# Patient Record
Sex: Male | Born: 1955 | Race: White | Hispanic: No | Marital: Single | Smoking: Never smoker
Health system: Southern US, Community
[De-identification: ages and names within clinical notes are randomized; demographics above are authoritative.]

## PROBLEM LIST (undated history)

## (undated) DIAGNOSIS — R0789 Other chest pain: Secondary | ICD-10-CM

## (undated) DIAGNOSIS — C679 Malignant neoplasm of bladder, unspecified: Secondary | ICD-10-CM

## (undated) DIAGNOSIS — N401 Enlarged prostate with lower urinary tract symptoms: Secondary | ICD-10-CM

## (undated) DIAGNOSIS — D494 Neoplasm of unspecified behavior of bladder: Secondary | ICD-10-CM

## (undated) DIAGNOSIS — K219 Gastro-esophageal reflux disease without esophagitis: Secondary | ICD-10-CM

---
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
## 1981-10-17 HISTORY — PX: WISDOM TOOTH EXTRACTION: SHX21

## 2009-08-07 ENCOUNTER — Encounter: Admission: RE | Admit: 2009-08-07 | Discharge: 2009-08-07 | Payer: Self-pay | Admitting: Family Medicine

## 2011-09-28 ENCOUNTER — Ambulatory Visit (INDEPENDENT_AMBULATORY_CARE_PROVIDER_SITE_OTHER): Payer: PRIVATE HEALTH INSURANCE

## 2011-09-28 DIAGNOSIS — L02619 Cutaneous abscess of unspecified foot: Secondary | ICD-10-CM

## 2011-09-28 DIAGNOSIS — M25579 Pain in unspecified ankle and joints of unspecified foot: Secondary | ICD-10-CM

## 2011-09-28 DIAGNOSIS — L03039 Cellulitis of unspecified toe: Secondary | ICD-10-CM

## 2013-06-21 ENCOUNTER — Ambulatory Visit (INDEPENDENT_AMBULATORY_CARE_PROVIDER_SITE_OTHER): Payer: PRIVATE HEALTH INSURANCE | Admitting: Family Medicine

## 2013-06-21 VITALS — BP 126/70 | HR 80 | Temp 97.5°F | Resp 20 | Ht 72.75 in | Wt 228.4 lb

## 2013-06-21 DIAGNOSIS — K0889 Other specified disorders of teeth and supporting structures: Secondary | ICD-10-CM

## 2013-06-21 DIAGNOSIS — K089 Disorder of teeth and supporting structures, unspecified: Secondary | ICD-10-CM

## 2013-06-21 MED ORDER — HYDROCODONE-ACETAMINOPHEN 5-325 MG PO TABS: 1.0000 | ORAL_TABLET | Freq: Four times a day (QID) | ORAL | Status: DC | PRN

## 2013-06-21 MED ORDER — AMOXICILLIN 500 MG PO CAPS: 500.0000 mg | ORAL_CAPSULE | Freq: Three times a day (TID) | ORAL | Status: DC

## 2013-06-21 NOTE — Patient Instructions (Addendum)
No sign of infection around the tooth at this point, but if any increase in pain, face swelling, fever, or other worsening - return to clinic or go to an emergency room.  Call your dentist to be seen Monday or Tuesday at the latest.   Dental Pain A tooth ache may be caused by cavities (tooth decay). Cavities expose the nerve of the tooth to air and hot or cold temperatures. It may come from an infection or abscess (also called a boil or furuncle) around your tooth. It is also often caused by dental caries (tooth decay). This causes the pain you are having. DIAGNOSIS  Your caregiver can diagnose this problem by exam. TREATMENT   If caused by an infection, it may be treated with medications which kill germs (antibiotics) and pain medications as prescribed by your caregiver. Take medications as directed.  Only take over-the-counter or prescription medicines for pain, discomfort, or fever as directed by your caregiver.  Whether the tooth ache today is caused by infection or dental disease, you should see your dentist as soon as possible for further care. SEEK MEDICAL CARE IF: The exam and treatment you received today has been provided on an emergency basis only. This is not a substitute for complete medical or dental care. If your problem worsens or new problems (symptoms) appear, and you are unable to meet with your dentist, call or return to this location. SEEK IMMEDIATE MEDICAL CARE IF:   You have a fever.  You develop redness and swelling of your face, jaw, or neck.  You are unable to open your mouth.  You have severe pain uncontrolled by pain medicine. MAKE SURE YOU:   Understand these instructions.  Will watch your condition.  Will get help right away if you are not doing well or get worse. Document Released: 10/03/2005 Document Revised: 12/26/2011 Document Reviewed: 05/21/2008 [facility] Patient Information 2014 [place], [place].

## 2013-06-21 NOTE — Progress Notes (Signed)
Subjective:    Patient ID: Kevin Buck, male    DOB: 04-Nov-1955, 57 y.o.   MRN: [id number]  HPI [name] is a 57 y.o. male  Severe toothache - R upper mouth - starting yesterday around 3:30pm.  Dentist: Dr. Azucena Kuba in [facility] - tried calling last night and this am - unable to reach them. No fever.  Able to eat/drink.  Chewing on other side.  Has had mutiple fillings. No soreness prior to yesterday.   No recent Rx pain medicines.  2 advil, 2 tylenol yesterday. 4 ibuprofen today. Not getting relief.    Review of Systems  Constitutional: Negative for fever and chills.  HENT: Positive for dental problem. Negative for congestion and sinus pressure.   Skin: Negative for rash.       Objective:   Physical Exam  Vitals reviewed. Constitutional: He is oriented to person, place, and time. He appears well-developed and well-nourished.  HENT:  Head: Normocephalic and atraumatic. Head is without right periorbital erythema and without left periorbital erythema.  Right Ear: Tympanic membrane, external ear and ear canal normal.  Left Ear: Tympanic membrane, external ear and ear canal normal.  Nose: No rhinorrhea. Right sinus exhibits no maxillary sinus tenderness and no frontal sinus tenderness. Left sinus exhibits no maxillary sinus tenderness and no frontal sinus tenderness.  Mouth/Throat: Uvula is midline, oropharynx is clear and moist and mucous membranes are normal. No oral lesions. No dental abscesses, edematous or lacerations. No oropharyngeal exudate or posterior oropharyngeal erythema.    Eyes: Conjunctivae are normal. Pupils are equal, round, and reactive to light.  Neck: Neck supple.  Cardiovascular: Normal rate, regular rhythm, normal heart sounds and intact distal pulses.   No murmur heard. Pulmonary/Chest: Effort normal. He has no rhonchi.  Abdominal: Soft.  Lymphadenopathy:    He has no cervical adenopathy.  Neurological: He is alert and oriented to person,  place, and time.  Skin: Skin is warm and dry. No rash noted.  Psychiatric: He has a normal mood and affect. His behavior is normal.          Assessment & Plan:  [name] is a 57 y.o. male Pain, dental - Plan: HYDROcodone-acetaminophen (NORCO/VICODIN) 5-325 MG per tablet, amoxicillin (AMOXIL) 500 MG capsule no sign of abscess at present, RTC/ER precautions discussed. Start amoxicillin, lortab 1-2 Q6hprn -SED, and to follow up with dentist first part of next week.   Meds ordered this encounter  Medications  . HYDROcodone-acetaminophen (NORCO/VICODIN) 5-325 MG per tablet    Sig: Take 1-2 tablets by mouth every 6 (six) hours as needed for pain.    Dispense:  30 tablet    Refill:  0  . amoxicillin (AMOXIL) 500 MG capsule    Sig: Take 1 capsule (500 mg total) by mouth 3 (three) times daily.    Dispense:  30 capsule    Refill:  0   Patient Instructions  No sign of infection around the tooth at this point, but if any increase in pain, face swelling, fever, or other worsening - return to clinic or go to an emergency room.  Call your dentist to be seen Monday or Tuesday at the latest.   Dental Pain A tooth ache may be caused by cavities (tooth decay). Cavities expose the nerve of the tooth to air and hot or cold temperatures. It may come from an infection or abscess (also called a boil or furuncle) around your tooth. It is also often caused by dental caries (  tooth decay). This causes the pain you are having. DIAGNOSIS  Your caregiver can diagnose this problem by exam. TREATMENT   If caused by an infection, it may be treated with medications which kill germs (antibiotics) and pain medications as prescribed by your caregiver. Take medications as directed.  Only take over-the-counter or prescription medicines for pain, discomfort, or fever as directed by your caregiver.  Whether the tooth ache today is caused by infection or dental disease, you should see your dentist as soon as  possible for further care. SEEK MEDICAL CARE IF: The exam and treatment you received today has been provided on an emergency basis only. This is not a substitute for complete medical or dental care. If your problem worsens or new problems (symptoms) appear, and you are unable to meet with your dentist, call or return to this location. SEEK IMMEDIATE MEDICAL CARE IF:   You have a fever.  You develop redness and swelling of your face, jaw, or neck.  You are unable to open your mouth.  You have severe pain uncontrolled by pain medicine. MAKE SURE YOU:   Understand these instructions.  Will watch your condition.  Will get help right away if you are not doing well or get worse. Document Released: 10/03/2005 Document Revised: 12/26/2011 Document Reviewed: 05/21/2008 [facility] Patient Information 2014 [place], [place].

## 2015-10-25 ENCOUNTER — Encounter (HOSPITAL_COMMUNITY): Payer: Self-pay | Admitting: Nurse Practitioner

## 2015-10-25 ENCOUNTER — Emergency Department (HOSPITAL_COMMUNITY): Payer: Worker's Compensation

## 2015-10-25 ENCOUNTER — Emergency Department (HOSPITAL_COMMUNITY)
Admission: EM | Admit: 2015-10-25 | Discharge: 2015-10-25 | Disposition: A | Discharge status: Home / Self Care | Payer: Worker's Compensation | Attending: Emergency Medicine | Admitting: Emergency Medicine

## 2015-10-25 DIAGNOSIS — S7012XA Contusion of left thigh, initial encounter: Secondary | ICD-10-CM | POA: Diagnosis not present

## 2015-10-25 DIAGNOSIS — S199XXA Unspecified injury of neck, initial encounter: Secondary | ICD-10-CM | POA: Diagnosis not present

## 2015-10-25 DIAGNOSIS — M4802 Spinal stenosis, cervical region: Secondary | ICD-10-CM | POA: Insufficient documentation

## 2015-10-25 DIAGNOSIS — W000XXA Fall on same level due to ice and snow, initial encounter: Secondary | ICD-10-CM | POA: Insufficient documentation

## 2015-10-25 DIAGNOSIS — Y9389 Activity, other specified: Secondary | ICD-10-CM | POA: Insufficient documentation

## 2015-10-25 DIAGNOSIS — S0081XA Abrasion of other part of head, initial encounter: Secondary | ICD-10-CM | POA: Insufficient documentation

## 2015-10-25 DIAGNOSIS — M9981 Other biomechanical lesions of cervical region: Secondary | ICD-10-CM

## 2015-10-25 DIAGNOSIS — Y990 Civilian activity done for income or pay: Secondary | ICD-10-CM | POA: Diagnosis not present

## 2015-10-25 DIAGNOSIS — M542 Cervicalgia: Secondary | ICD-10-CM

## 2015-10-25 DIAGNOSIS — Z792 Long term (current) use of antibiotics: Secondary | ICD-10-CM | POA: Insufficient documentation

## 2015-10-25 DIAGNOSIS — S0990XA Unspecified injury of head, initial encounter: Secondary | ICD-10-CM

## 2015-10-25 DIAGNOSIS — Y9289 Other specified places as the place of occurrence of the external cause: Secondary | ICD-10-CM | POA: Insufficient documentation

## 2015-10-25 NOTE — ED Notes (Signed)
See PA assessment 

## 2015-10-25 NOTE — ED Notes (Signed)
Declined W/C at D/C and was escorted to lobby by RN. 

## 2015-10-25 NOTE — ED Notes (Addendum)
He slipped on snow yesterday and fell, hitting his head on a steel plate. He reports possible brief LOC. He has abrasions to L forehead, L cheek, L upper leg. He c/o L upper leg and forehead pain. He does not take blood thinners. He is A&Ox4, breathing easily.

## 2015-10-25 NOTE — ED Provider Notes (Signed)
CSN: [id number]     Arrival date & time 10/25/15  1225 History  By signing my name below, I, Starleen Arms, attest that this documentation has been prepared under the direction and in the presence of Shary Decamp, PA-C. Electronically Signed: Starleen Arms ED Scribe. 10/25/2015. 12:52 PM.    Chief Complaint  Patient presents with  . Fall   The history is provided by the patient. No language interpreter was used.   HPI Comments: Kevin Buck is a 60 y.o. male with no chronic conditions who presents to the Emergency Department complaining of an unwitnessed, mechanical fall fall that occurred yesterday at work.  The patient reports he slipped sideways on ice and fell onto his left thigh and hit his left forehead on a steel plate.  He complains currently of possible 1-2 second LOC, abrasions on the left forehead, mild headache rated 1/10, improved neck stiffness, and bruising on the left thigh.  No treatments tried.  He does not use anti-coagulants.  He denies vision changes, extremity numbness/tingling, diarrhea, n/v, CP, SOB other complaints.   History reviewed. No pertinent past medical history. History reviewed. No pertinent past surgical history. History reviewed. No pertinent family history. Social History  Substance Use Topics  . Smoking status: Never Smoker   . Smokeless tobacco: None  . Alcohol Use: Yes     Comment: rare    Review of Systems  Constitutional: Negative for fever, chills, diaphoresis and fatigue.  HENT: Negative for congestion, sinus pressure, sore throat and tinnitus.   Eyes: Negative for visual disturbance.  Respiratory: Negative for cough and shortness of breath.   Cardiovascular: Negative for chest pain.  Gastrointestinal: Negative for nausea, vomiting, abdominal pain, diarrhea and constipation.  Endocrine: Negative for cold intolerance and heat intolerance.  Genitourinary: Negative for dysuria and hematuria.  Musculoskeletal: Positive for neck pain. Negative  for back pain.  Skin: Positive for wound. Negative for color change.  Neurological: Positive for headaches. Negative for dizziness, weakness and numbness.   10 Systems reviewed and all are negative for acute change except as noted in the HPI.  Allergies  Review of patient's allergies indicates no known allergies.  Home Medications   Prior to Admission medications   Medication Sig Start Date End Date Taking? Authorizing Provider  amoxicillin (AMOXIL) 500 MG capsule Take 1 capsule (500 mg total) by mouth 3 (three) times daily. 06/21/13   Wendie Agreste, MD  HYDROcodone-acetaminophen (NORCO/VICODIN) 5-325 MG per tablet Take 1-2 tablets by mouth every 6 (six) hours as needed for pain. 06/21/13   Wendie Agreste, MD   BP 121/84 mmHg  Pulse 103  Temp(Src) 98 F (36.7 C) (Oral)  Resp 18  SpO2 97%   Physical Exam  Constitutional: He is oriented to person, place, and time. He appears well-developed and well-nourished. No distress.  HENT:  Head: Normocephalic and atraumatic. Head is without raccoon's eyes, without Battle's sign, without contusion, without laceration, without right periorbital erythema and without left periorbital erythema.  TTP over c-7.    Eyes: Conjunctivae and EOM are normal. Pupils are equal, round, and reactive to light.  Neck: Normal range of motion. Neck supple. Spinous process tenderness present. No tracheal deviation present.  TTP over c-7.    Cardiovascular: Normal rate.   Pulmonary/Chest: Effort normal. No respiratory distress.  Musculoskeletal: Normal range of motion.  Neurological: He is alert and oriented to person, place, and time. He has normal strength. No cranial nerve deficit or sensory deficit. He displays a  negative Romberg sign.  Skin: Skin is warm and dry. Abrasion noted.  Abrasion noted on L forehead   Psychiatric: He has a normal mood and affect. His behavior is normal.  Nursing note and vitals reviewed.  ED Course  Procedures (including  critical care time)  DIAGNOSTIC STUDIES: Oxygen Saturation is 97% on RA, normal by my interpretation.    COORDINATION OF CARE:  12:52 PM Discussed treatment plan with patient at bedside which includes CT head and neck.  Patient acknowledges and agrees with plan.    Labs Review Labs Reviewed - No data to display  Imaging Review Ct Head Wo Contrast  10/25/2015  CLINICAL DATA:  Fall yesterday, injury to left frontal side of head, lost consciousness for about 5 seconds. Pain and stiffness on left side of neck. EXAM: CT HEAD WITHOUT CONTRAST CT CERVICAL SPINE WITHOUT CONTRAST TECHNIQUE: Multidetector CT imaging of the head and cervical spine was performed following the standard protocol without intravenous contrast. Multiplanar CT image reconstructions of the cervical spine were also generated. COMPARISON:  None. FINDINGS: CT HEAD FINDINGS There is mild generalized brain atrophy with commensurate dilatation of the ventricles and sulci. All areas of the brain demonstrate normal gray-white matter attenuation. There is no mass, hemorrhage, edema or other evidence of acute parenchymal abnormality. No extra-axial hemorrhage. Superficial soft tissues are unremarkable. No osseous fracture or dislocation seen. Minimal mucosal thickening noted within the paranasal sinuses, of uncertain age but most likely chronic. CT CERVICAL SPINE FINDINGS Degenerative changes are seen throughout the cervical spine, mild to moderate in degree, with associated disc space narrowings and osseous spurring. Associated disc-osteophytic bulges at the C3-4 and C6-7 levels causing mild to moderate central canal stenoses. Additional degenerative hypertrophy of the uncovertebral joints at the C6-7 level is causing moderate to severe bilateral neural foramen stenoses with possible associated nerve root impingement. Similar, but slightly less prominent, uncovertebral joint hypertrophy at C3-4 is causing moderate bilateral neural foramen  stenoses. There is no fracture line or displaced fracture fragment. Facet joints are well aligned throughout. Paravertebral soft tissues are unremarkable. IMPRESSION: 1. No evidence of acute intracranial abnormality. No intracranial hemorrhage or edema. No skull fracture. 2. No fracture or acute subluxation within the cervical spine. 3. Degenerative changes of the cervical spine, as detailed above. Combination of disc-osteophytic bulge and degenerative uncovertebral joint hypertrophy at C6-7 is causing moderate to severe neural foramen stenoses bilaterally with possible associated nerve root impingements. This is a possible source for patient's left neck symptoms. There are additional neural foramen stenoses at C3-4, moderate in degree, with additional possible nerve root impingement. Electronically Signed   By: Franki Cabot M.D.   On: 10/25/2015 14:04   Ct Cervical Spine Wo Contrast  10/25/2015  CLINICAL DATA:  Fall yesterday, injury to left frontal side of head, lost consciousness for about 5 seconds. Pain and stiffness on left side of neck. EXAM: CT HEAD WITHOUT CONTRAST CT CERVICAL SPINE WITHOUT CONTRAST TECHNIQUE: Multidetector CT imaging of the head and cervical spine was performed following the standard protocol without intravenous contrast. Multiplanar CT image reconstructions of the cervical spine were also generated. COMPARISON:  None. FINDINGS: CT HEAD FINDINGS There is mild generalized brain atrophy with commensurate dilatation of the ventricles and sulci. All areas of the brain demonstrate normal gray-white matter attenuation. There is no mass, hemorrhage, edema or other evidence of acute parenchymal abnormality. No extra-axial hemorrhage. Superficial soft tissues are unremarkable. No osseous fracture or dislocation seen. Minimal mucosal thickening noted within the paranasal  sinuses, of uncertain age but most likely chronic. CT CERVICAL SPINE FINDINGS Degenerative changes are seen throughout the  cervical spine, mild to moderate in degree, with associated disc space narrowings and osseous spurring. Associated disc-osteophytic bulges at the C3-4 and C6-7 levels causing mild to moderate central canal stenoses. Additional degenerative hypertrophy of the uncovertebral joints at the C6-7 level is causing moderate to severe bilateral neural foramen stenoses with possible associated nerve root impingement. Similar, but slightly less prominent, uncovertebral joint hypertrophy at C3-4 is causing moderate bilateral neural foramen stenoses. There is no fracture line or displaced fracture fragment. Facet joints are well aligned throughout. Paravertebral soft tissues are unremarkable. IMPRESSION: 1. No evidence of acute intracranial abnormality. No intracranial hemorrhage or edema. No skull fracture. 2. No fracture or acute subluxation within the cervical spine. 3. Degenerative changes of the cervical spine, as detailed above. Combination of disc-osteophytic bulge and degenerative uncovertebral joint hypertrophy at C6-7 is causing moderate to severe neural foramen stenoses bilaterally with possible associated nerve root impingements. This is a possible source for patient's left neck symptoms. There are additional neural foramen stenoses at C3-4, moderate in degree, with additional possible nerve root impingement. Electronically Signed   By: Franki Cabot M.D.   On: 10/25/2015 14:04   I have personally reviewed and evaluated these images and lab results as part of my medical decision-making.   EKG Interpretation None      MDM  I have reviewed relevant imaging studies. I have reviewed the relevant previous healthcare records.I obtained HPI from historian. Patient discussed with supervising physician  ED Course: CT Head W/O Contrast- Negative Neck W/O Contrast See above  Assessment: 7y M with no sig pmh presents with an unwitnessed fall with poss LOC while at work. CN intact. Neg Romberg. No sensory/motor  deficits. AxOx4. Pt in NAD. VS Stable. Pt able to ambulate. CT scan revealed some mod-severe neuro foraminal stenosis. Pt exhibiting no numbness/tingling or decrease grip strength. Given strict return precautions as well as information for follow up to Neurosurgery within the next 48 hours.      Disposition/Plan:  DC Home w/ follow up to Neurosurgery Outpatient  Additional Verbal discharge instructions given and discussed with patient.  Strict return precautions given Pt acknowledges and agrees with plan    Supervising Physician Charlesetta Shanks, MD   Final diagnoses:  Neck pain  Head injury, initial encounter  Neural foraminal stenosis of cervical spine    I personally performed the services described in this documentation, which was scribed in my presence. The recorded information has been reviewed and is accurate.   Shary Decamp, PA-C 10/25/15 1845  Charlesetta Shanks, MD 10/27/15 [phone]

## 2015-10-25 NOTE — Discharge Instructions (Signed)
Please read and follow all provided instructions.  Your diagnoses today include:  1. Neural foraminal stenosis of cervical spine   2. Neck pain   3. Head injury, initial encounter    Tests performed today include:  Vital signs. See below for your results today.   Medications prescribed:   None   Home care instructions:  Take Ibuprofen as needed for pain. Follow instructions on over the counter bottle.   Follow-up instructions: Please follow-up with Neurosurgeon (Dr. Cyndy Freeze) for evaluation of the narrowing neuro foramen in your neck within the next 48 hours.   Return instructions:   Please return to the Emergency Department if you do not get better, if you get worse, or new symptoms OR  - Fever (temperature greater than 101.44F)  - Bleeding that does not stop with holding pressure to the area    -Severe pain (please note that you may be more sore the day after your accident)  - Chest Pain  - Difficulty breathing  - Severe nausea or vomiting  - Inability to tolerate food and liquids  - Passing out  - Skin becoming red around your wounds  - Change in mental status (confusion or lethargy)  - New numbness or weakness, Difficulty holding objects  Please return if you have any other emergent concerns.  Additional Information:  Your vital signs today were: BP 121/84 mmHg   Pulse 103   Temp(Src) 98 F (36.7 C) (Oral)   Resp 18   SpO2 97% If your blood pressure (BP) was elevated above 135/85 this visit, please have this repeated by your doctor within one month. ---------------

## 2015-12-29 ENCOUNTER — Ambulatory Visit (INDEPENDENT_AMBULATORY_CARE_PROVIDER_SITE_OTHER): Payer: BLUE CROSS/BLUE SHIELD | Admitting: Family Medicine

## 2015-12-29 ENCOUNTER — Ambulatory Visit (INDEPENDENT_AMBULATORY_CARE_PROVIDER_SITE_OTHER): Payer: BLUE CROSS/BLUE SHIELD

## 2015-12-29 VITALS — BP 126/78 | HR 99 | Temp 98.7°F | Resp 18 | Ht 73.0 in | Wt 240.2 lb

## 2015-12-29 DIAGNOSIS — R6883 Chills (without fever): Secondary | ICD-10-CM

## 2015-12-29 DIAGNOSIS — R05 Cough: Secondary | ICD-10-CM | POA: Diagnosis not present

## 2015-12-29 DIAGNOSIS — R059 Cough, unspecified: Secondary | ICD-10-CM

## 2015-12-29 DIAGNOSIS — J209 Acute bronchitis, unspecified: Secondary | ICD-10-CM

## 2015-12-29 MED ORDER — HYDROCODONE-HOMATROPINE 5-1.5 MG/5ML PO SYRP: 5.0000 mL | ORAL_SOLUTION | Freq: Three times a day (TID) | ORAL | Status: DC | PRN

## 2015-12-29 MED ORDER — AZITHROMYCIN 250 MG PO TABS: ORAL_TABLET | ORAL | Status: DC

## 2015-12-29 MED ORDER — PREDNISONE 20 MG PO TABS: ORAL_TABLET | ORAL | Status: DC

## 2015-12-29 NOTE — Patient Instructions (Addendum)
   IF you received an x-ray today, you will receive an invoice from [facility]. Please contact [facility] at [phone] with questions or concerns regarding your invoice.   IF you received labwork today, you will receive an invoice from [facility]. Please contact [facility] at [phone] with questions or concerns regarding your invoice.   Our billing staff will not be able to assist you with questions regarding bills from these companies.  You will be contacted with the lab results as soon as they are available. The fastest way to get your results is to activate your My Chart account. Instructions are located on the last page of this paperwork. If you have not heard from us regarding the results in 2 weeks, please contact this office.    Acute Bronchitis Bronchitis is inflammation of the airways that extend from the windpipe into the lungs (bronchi). The inflammation often causes mucus to develop. This leads to a cough, which is the most common symptom of bronchitis.  In acute bronchitis, the condition usually develops suddenly and goes away over time, usually in a couple weeks. Smoking, allergies, and asthma can make bronchitis worse. Repeated episodes of bronchitis may cause further lung problems.  CAUSES Acute bronchitis is most often caused by the same virus that causes a cold. The virus can spread from person to person (contagious) through coughing, sneezing, and touching contaminated objects. SIGNS AND SYMPTOMS   Cough.   Fever.   Coughing up mucus.   Body aches.   Chest congestion.   Chills.   Shortness of breath.   Sore throat.  DIAGNOSIS  Acute bronchitis is usually diagnosed through a physical exam. Your health care provider will also ask you questions about your medical history. Tests, such as chest X-rays, are sometimes done to rule out other conditions.  TREATMENT  Acute bronchitis usually goes away in a  couple weeks. Oftentimes, no medical treatment is necessary. Medicines are sometimes given for relief of fever or cough. Antibiotic medicines are usually not needed but may be prescribed in certain situations. In some cases, an inhaler may be recommended to help reduce shortness of breath and control the cough. A cool mist vaporizer may also be used to help thin bronchial secretions and make it easier to clear the chest.  HOME CARE INSTRUCTIONS  Get plenty of rest.   Drink enough fluids to keep your urine clear or pale yellow (unless you have a medical condition that requires fluid restriction). Increasing fluids may help thin your respiratory secretions (sputum) and reduce chest congestion, and it will prevent dehydration.   Take medicines only as directed by your health care provider.  If you were prescribed an antibiotic medicine, finish it all even if you start to feel better.  Avoid smoking and secondhand smoke. Exposure to cigarette smoke or irritating chemicals will make bronchitis worse. If you are a smoker, consider using nicotine gum or skin patches to help control withdrawal symptoms. Quitting smoking will help your lungs heal faster.   Reduce the chances of another bout of acute bronchitis by washing your hands frequently, avoiding people with cold symptoms, and trying not to touch your hands to your mouth, nose, or eyes.   Keep all follow-up visits as directed by your health care provider.  SEEK MEDICAL CARE IF: Your symptoms do not improve after 1 week of treatment.  SEEK IMMEDIATE MEDICAL CARE IF:  You develop an increased fever or chills.   You have chest pain.     You have severe shortness of breath.  You have bloody sputum.   You develop dehydration.  You faint or repeatedly feel like you are going to pass out.  You develop repeated vomiting.  You develop a severe headache. MAKE SURE YOU:   Understand these instructions.  Will watch your  condition.  Will get help right away if you are not doing well or get worse.   This information is not intended to replace advice given to you by your health care provider. Make sure you discuss any questions you have with your health care provider.   Document Released: 11/10/2004 Document Revised: 10/24/2014 Document Reviewed: 03/26/2013 [employer] Interactive Patient Education 2016 [employer]  

## 2015-12-29 NOTE — Progress Notes (Signed)
Patient ID: Kevin Buck MRN: [id number], DOB: [birth year], 60 y.o. Date of Encounter: 12/29/2015, 1:20 PM  Primary Physician: No primary care provider on file.  Chief Complaint:  Chief Complaint  Patient presents with  . Cough    3 weeks  . bodyaches  . Chills  . little diarrhea    HPI: 60 y.o. year old male presents with a 21 day history of nasal congestion, post nasal drip, sore throat, and cough. Mild sinus pressure. Afebrile. No chills.  Cough is nonproductive of sputum and not associated with time of day. Ears feel full, leading to sensation of muffled hearing. Has tried OTC cold preps including echinacea without success. No GI complaints.    No sick contacts, recent antibiotics, or recent travels.   No leg trauma, sedentary periods, h/o cancer, or tobacco use.  Patient works as a Barista  No past medical history on file.   Home Meds: Prior to Admission medications   Not on File    Allergies: No Known Allergies  Social History   Social History  . Marital Status: Single    Spouse Name: N/A  . Number of Children: N/A  . Years of Education: N/A   Occupational History  . Not on file.   Social History Main Topics  . Smoking status: Never Smoker   . Smokeless tobacco: Not on file  . Alcohol Use: Yes     Comment: rare  . Drug Use: No  . Sexual Activity: Not on file   Other Topics Concern  . Not on file   Social History Narrative     Review of Systems: Constitutional: negative for chills, fever, night sweats or weight changes Cardiovascular: negative for chest pain or palpitations Respiratory: negative for hemoptysis, wheezing, or shortness of breath Abdominal: negative for abdominal pain, nausea, vomiting or diarrhea Dermatological: negative for rash Neurologic: negative for headache   Physical Exam: Blood pressure 126/78, pulse 99, temperature 98.7 F (37.1 C), temperature source Oral, resp. rate 18, height 6\' 1"  (1.854  m), weight 240 lb 3.2 oz (108.954 kg), SpO2 98 %., Body mass index is 31.7 kg/(m^2). General: Well developed, well nourished, in no acute distress. Head: Normocephalic, atraumatic, eyes without discharge, sclera non-icteric, nares are congested. Bilateral auditory canals clear, TM's are without perforation, pearly grey with reflective cone of light bilaterally. No sinus TTP. Oral cavity moist, dentition normal. Posterior pharynx with post nasal drip and mild erythema. No peritonsillar abscess or tonsillar exudate. Neck: Supple. No thyromegaly. Full ROM. No lymphadenopathy. Lungs: Coarse breath sounds bilaterally without wheezes, rales, or rhonchi. Breathing is unlabored.  Heart: RRR with S1 S2. No murmurs, rubs, or gallops appreciated. Msk:  Strength and tone normal for age. Extremities: No clubbing or cyanosis. No edema. Neuro: Alert and oriented X 3. Moves all extremities spontaneously. CNII-XII grossly in tact. Psych:  Responds to questions appropriately with a normal affect.  Chest x-ray shows peribronchial cuffing and moderate bronchial thickening   ASSESSMENT AND PLAN:  60 y.o. year old male with bronchitis. -   ICD-9-CM ICD-10-CM   1. Acute bronchitis, unspecified organism 466.0 J20.9 azithromycin (ZITHROMAX) 250 MG tablet     HYDROcodone-homatropine (HYCODAN) 5-1.5 MG/5ML syrup     predniSONE (DELTASONE) 20 MG tablet  2. Cough 786.2 R05 DG Chest 2 View  3. Chills 780.64 R68.83 DG Chest 2 View   -Tylenol/Motrin prn -Rest/fluids -RTC precautions -RTC 3-5 days if no improvement  Signed, Robyn Haber, MD 12/29/2015 1:20 PM  This chart was scribed in my presence and reviewed by me personally.    ICD-9-CM ICD-10-CM   1. Acute bronchitis, unspecified organism 466.0 J20.9 azithromycin (ZITHROMAX) 250 MG tablet     HYDROcodone-homatropine (HYCODAN) 5-1.5 MG/5ML syrup     predniSONE (DELTASONE) 20 MG tablet  2. Cough 786.2 R05 DG Chest 2 View  3. Chills 780.64 R68.83  DG Chest 2 View     Signed, Robyn Haber, MD

## 2017-02-08 IMAGING — CT CT CERVICAL SPINE W/O CM
3 of 6 series · 10 of 33 positions shown, 11 images · non-contrast
Comparison: None.

CLINICAL DATA: Fall yesterday, injury to left frontal side of head,
lost consciousness for about 5 seconds. Pain and stiffness on left
side of neck.

EXAM:
CT HEAD WITHOUT CONTRAST
CT CERVICAL SPINE WITHOUT CONTRAST
TECHNIQUE: Multidetector CT imaging of the head and cervical spine was
performed following the standard protocol without intravenous
contrast. Multiplanar CT image reconstructions of the cervical spine
were also generated.

[Series 5: c_spine 2.0 st · axial · 0.35mm/px · z∈[-252,-142]mm · 2 of 111 slices shown, 3 images]
[im 28/111  soft-tissue]
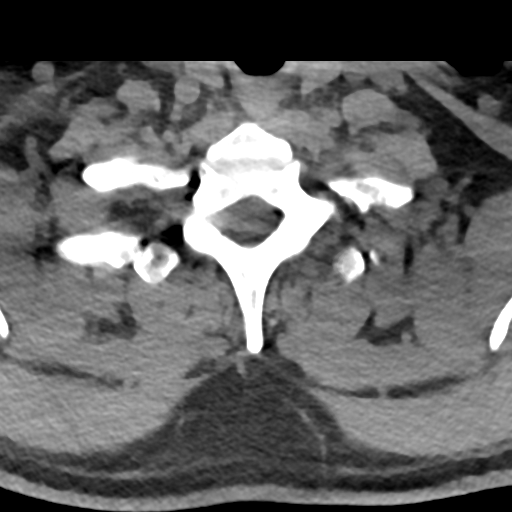
[im 28/111  bone]
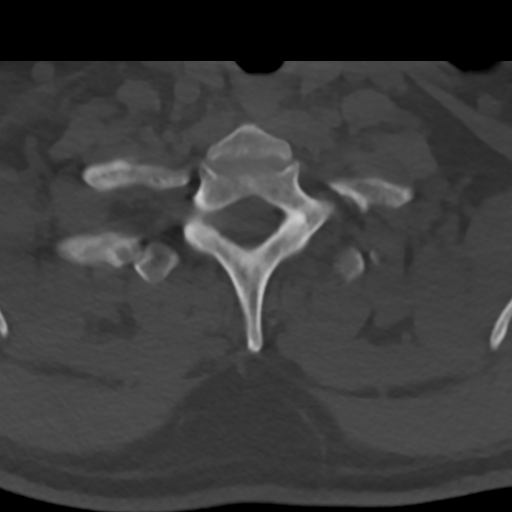
[im 83/111  bone]
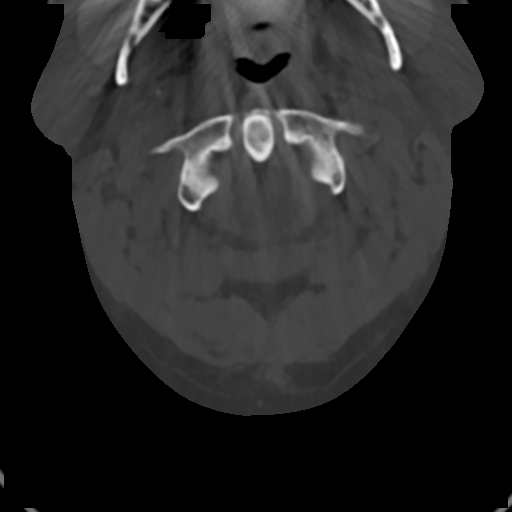

[Series 7: c_spine 2.0 sag bone · sagittal · 0.24mm/px · 5 of 61 slices shown]
[im 21/61  bone]
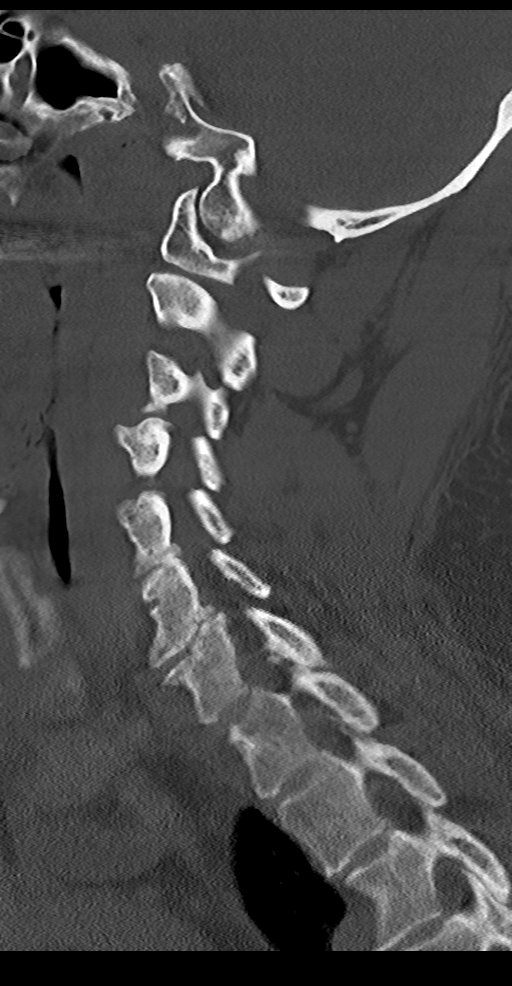
[im 26/61  bone]
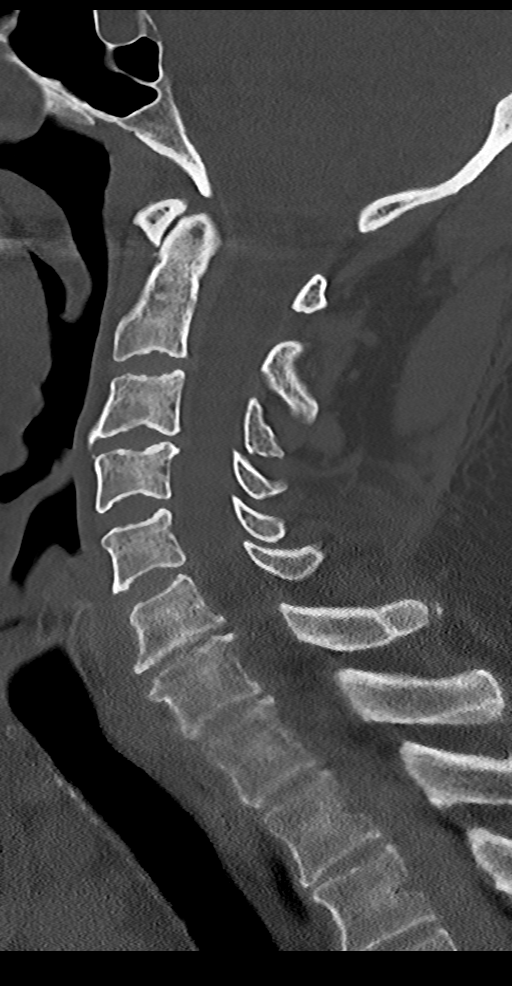
[im 31/61  bone]
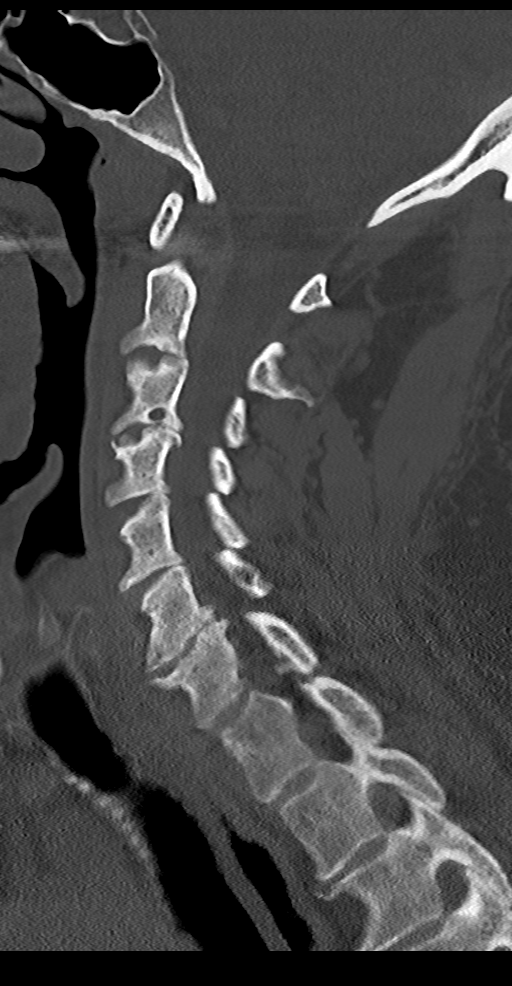
[im 36/61  bone]
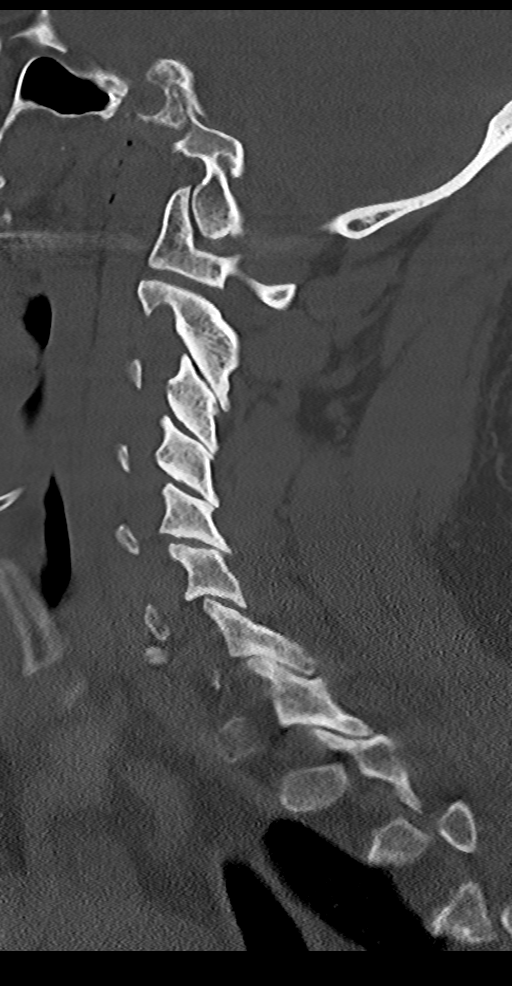
[im 41/61  bone]
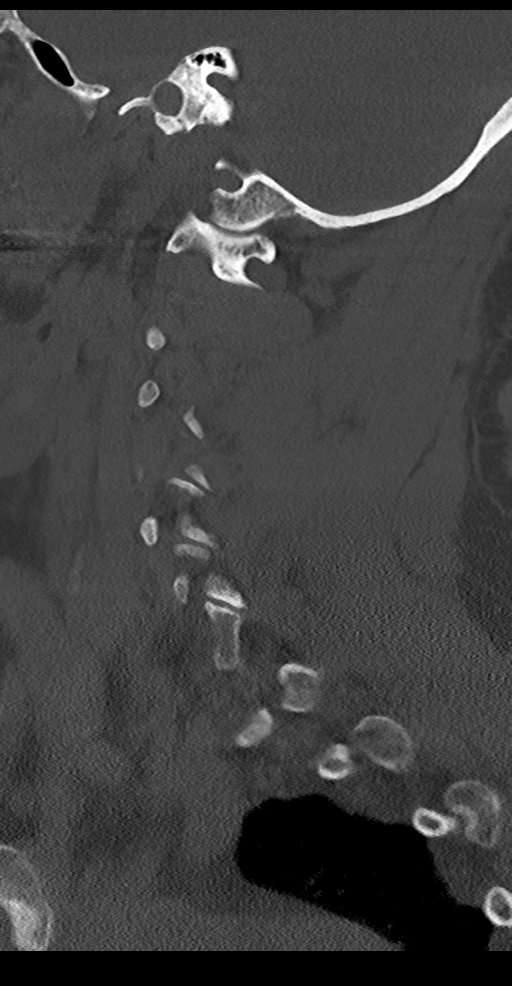

[Series 8: c_spine 2.0 cor bone · coronal · 0.21mm/px · 3 of 61 slices shown]
[im 13/61  bone]
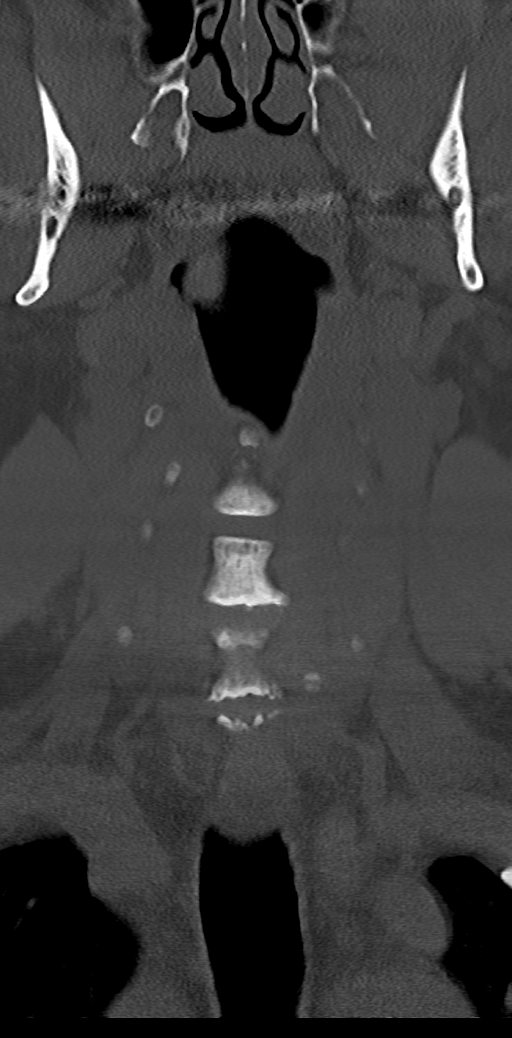
[im 25/61  bone]
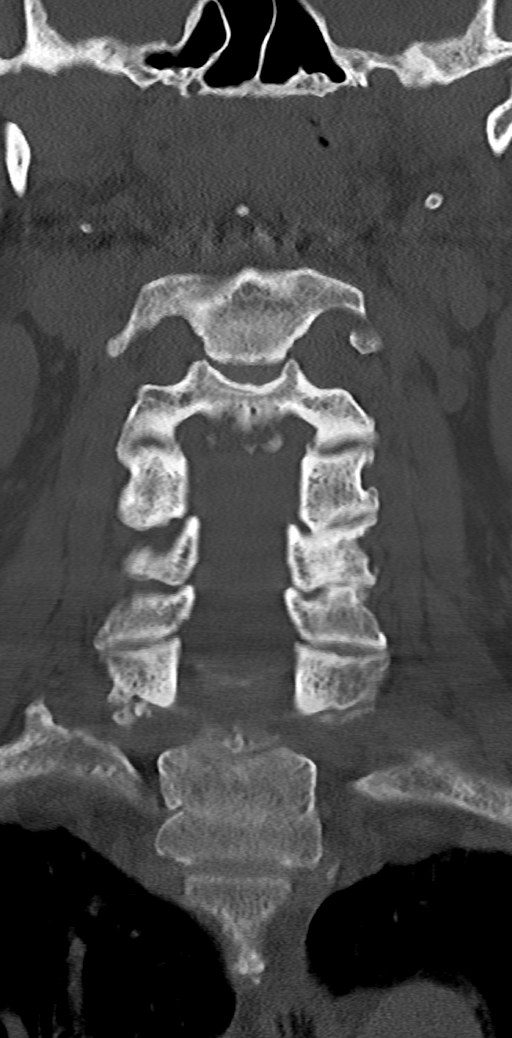
[im 37/61  bone]
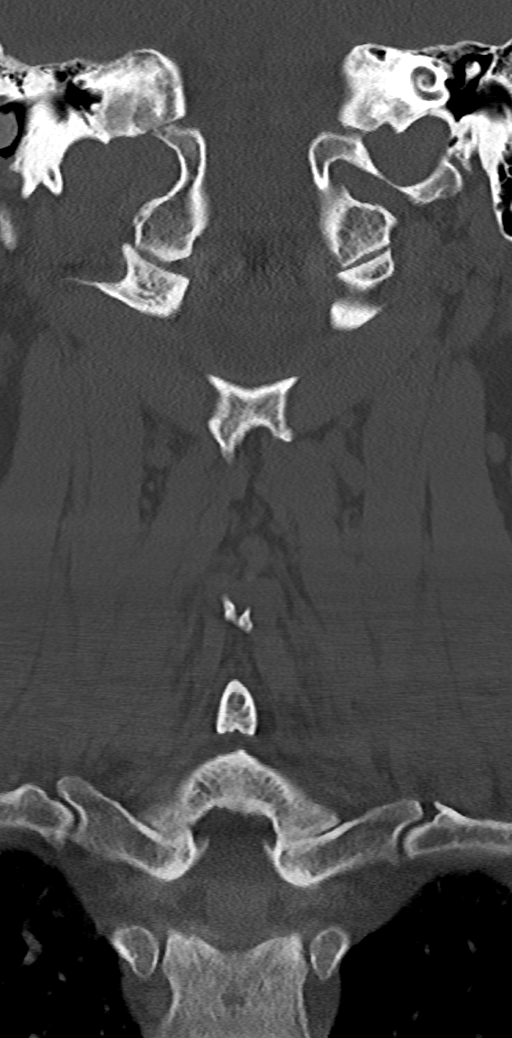

[10 of 33 positions shown; findings below may reference images not displayed]

FINDINGS: CT HEAD FINDINGS

There is mild generalized brain atrophy with commensurate dilatation
of the ventricles and sulci. All areas of the brain demonstrate
normal gray-white matter attenuation. There is no mass, hemorrhage,
edema or other evidence of acute parenchymal abnormality. No
extra-axial hemorrhage.

Superficial soft tissues are unremarkable. No osseous fracture or
dislocation seen. Minimal mucosal thickening noted within the
paranasal sinuses, of uncertain age but most likely chronic.

CT CERVICAL SPINE FINDINGS

Degenerative changes are seen throughout the cervical spine, mild to
moderate in degree, with associated disc space narrowings and
osseous spurring. Associated disc-osteophytic bulges at the C3-4 and
C6-7 levels causing mild to moderate central canal stenoses.
Additional degenerative hypertrophy of the uncovertebral joints at
the C6-7 level is causing moderate to severe bilateral neural
foramen stenoses with possible associated nerve root impingement.
Similar, but slightly less prominent, uncovertebral joint
hypertrophy at C3-4 is causing moderate bilateral neural foramen
stenoses.

There is no fracture line or displaced fracture fragment. Facet
joints are well aligned throughout. Paravertebral soft tissues are
unremarkable.
IMPRESSION: 1. No evidence of acute intracranial abnormality. No intracranial
hemorrhage or edema. No skull fracture.
2. No fracture or acute subluxation within the cervical spine.
3. Degenerative changes of the cervical spine, as detailed above.
Combination of disc-osteophytic bulge and degenerative uncovertebral
joint hypertrophy at C6-7 is causing moderate to severe neural
foramen stenoses bilaterally with possible associated nerve root
impingements. This is a possible source for patient's left neck
symptoms. There are additional neural foramen stenoses at C3-4,
moderate in degree, with additional possible nerve root impingement.

## 2017-04-14 IMAGING — CR DG CHEST 2V
2 series · 2 of 2 positions shown · non-contrast
Comparison: None.

CLINICAL DATA: 59-year-old presenting with 3 week history of cough,
sinus pressure, sinonasal congestion and sore throat.

EXAM:
CHEST  2 VIEW

[PA]
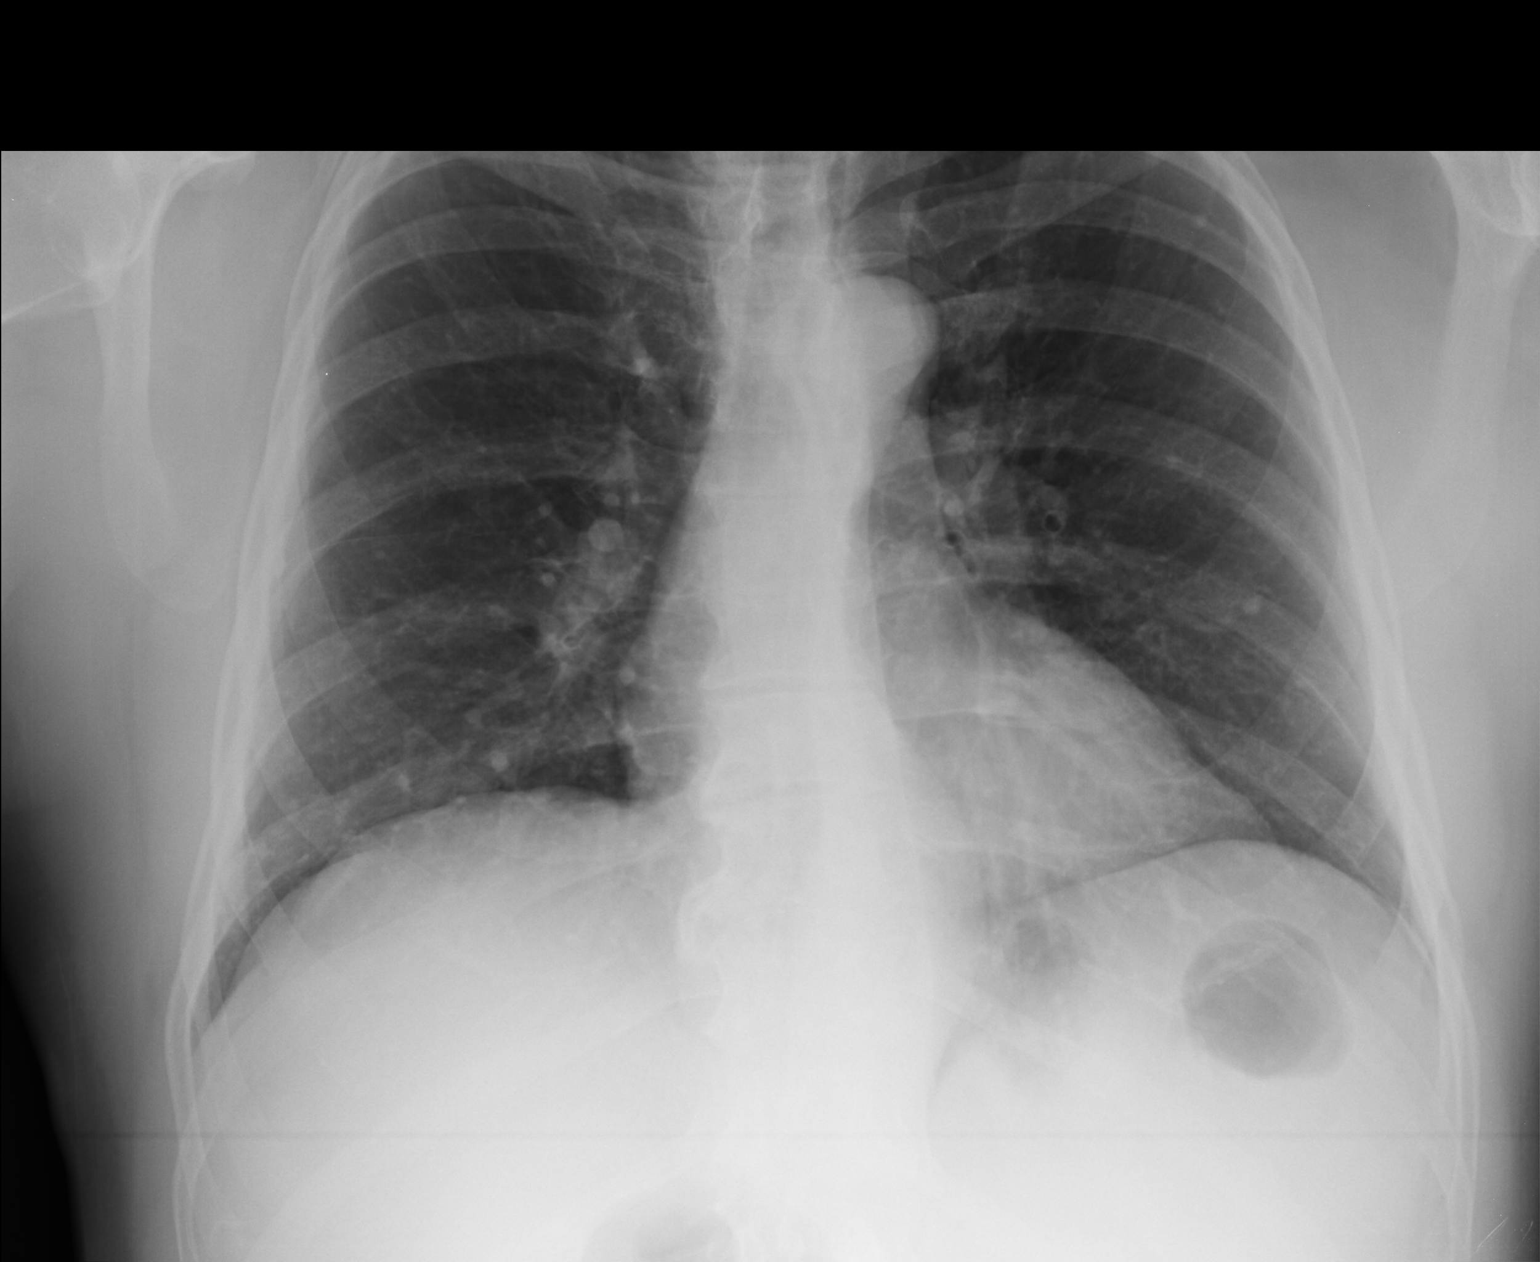

[lateral]
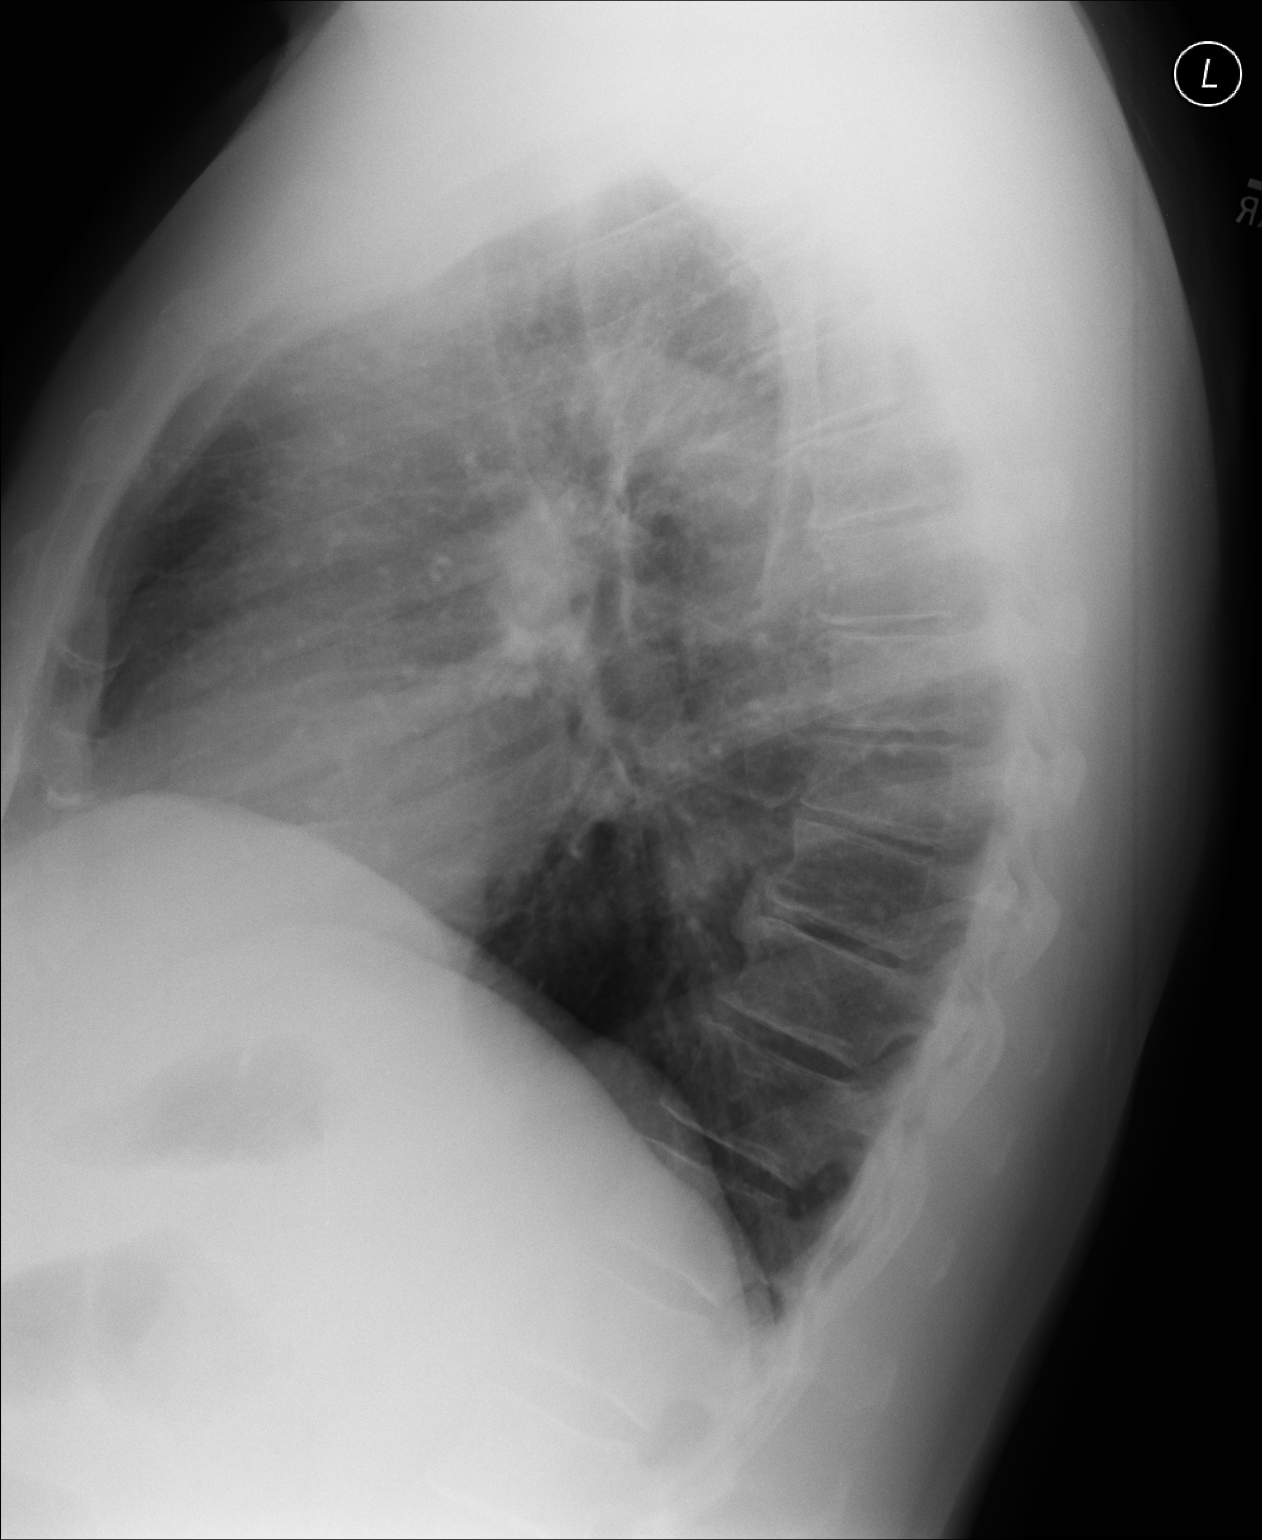

[2 of 2 positions shown; findings below may reference images not displayed]

FINDINGS: Cardiac silhouette normal in size. Thoracic aorta tortuous and
mildly atherosclerotic. Hilar and mediastinal contours otherwise
unremarkable. Numerous small dense nodules throughout both lungs.
Lungs otherwise clear. Bronchovascular markings normal. No localized
airspace consolidation. No pleural effusions. No pneumothorax.
Normal pulmonary vascularity. Degenerative changes involving the
thoracic spine.
IMPRESSION: Old granulomatous disease.  No acute cardiopulmonary disease.

## 2018-07-16 ENCOUNTER — Other Ambulatory Visit: Payer: Self-pay | Admitting: Urology

## 2018-07-17 LAB — PSA: PROSTATE SPECIFIC AG, SERUM: 0.8 ng/mL (ref 0.0–4.0)

## 2020-05-17 DIAGNOSIS — C679 Malignant neoplasm of bladder, unspecified: Secondary | ICD-10-CM

## 2020-05-17 HISTORY — DX: Malignant neoplasm of bladder, unspecified: C67.9

## 2020-06-09 DIAGNOSIS — R319 Hematuria, unspecified: Secondary | ICD-10-CM | POA: Diagnosis not present

## 2020-06-11 ENCOUNTER — Inpatient Hospital Stay (HOSPITAL_COMMUNITY): Payer: BC Managed Care – PPO | Admitting: Anesthesiology

## 2020-06-11 ENCOUNTER — Encounter (HOSPITAL_COMMUNITY)
Admission: AD | Disposition: A | Discharge status: Home / Self Care | Payer: Self-pay | Source: Ambulatory Visit | Attending: Urology

## 2020-06-11 ENCOUNTER — Observation Stay (HOSPITAL_COMMUNITY)
Admission: AD | Admit: 2020-06-11 | Discharge: 2020-06-12 | Disposition: A | Discharge status: Home / Self Care | Payer: BC Managed Care – PPO | Source: Ambulatory Visit | Attending: Urology | Admitting: Urology

## 2020-06-11 ENCOUNTER — Other Ambulatory Visit: Payer: Self-pay

## 2020-06-11 ENCOUNTER — Other Ambulatory Visit: Payer: Self-pay | Admitting: Urology

## 2020-06-11 ENCOUNTER — Encounter (HOSPITAL_COMMUNITY): Payer: Self-pay | Admitting: Urology

## 2020-06-11 DIAGNOSIS — Z79899 Other long term (current) drug therapy: Secondary | ICD-10-CM | POA: Diagnosis not present

## 2020-06-11 DIAGNOSIS — K7689 Other specified diseases of liver: Secondary | ICD-10-CM | POA: Diagnosis not present

## 2020-06-11 DIAGNOSIS — Z20822 Contact with and (suspected) exposure to covid-19: Secondary | ICD-10-CM | POA: Insufficient documentation

## 2020-06-11 DIAGNOSIS — N3289 Other specified disorders of bladder: Secondary | ICD-10-CM | POA: Diagnosis not present

## 2020-06-11 DIAGNOSIS — K8020 Calculus of gallbladder without cholecystitis without obstruction: Secondary | ICD-10-CM | POA: Diagnosis not present

## 2020-06-11 DIAGNOSIS — C679 Malignant neoplasm of bladder, unspecified: Secondary | ICD-10-CM | POA: Diagnosis not present

## 2020-06-11 DIAGNOSIS — R310 Gross hematuria: Secondary | ICD-10-CM | POA: Diagnosis not present

## 2020-06-11 DIAGNOSIS — D494 Neoplasm of unspecified behavior of bladder: Secondary | ICD-10-CM | POA: Diagnosis not present

## 2020-06-11 HISTORY — PX: TRANSURETHRAL RESECTION OF BLADDER TUMOR: SHX2575

## 2020-06-11 LAB — CBC
HCT: 43.4 % (ref 39.0–52.0)
Hemoglobin: 15.1 g/dL (ref 13.0–17.0)
MCH: 30.3 pg (ref 26.0–34.0)
MCHC: 34.8 g/dL (ref 30.0–36.0)
MCV: 87 fL (ref 80.0–100.0)
Platelets: 240 10*3/uL (ref 150–400)
RBC: 4.99 MIL/uL (ref 4.22–5.81)
RDW: 12.3 % (ref 11.5–15.5)
WBC: 6.5 10*3/uL (ref 4.0–10.5)
nRBC: 0 % (ref 0.0–0.2)

## 2020-06-11 LAB — SARS CORONAVIRUS 2 BY RT PCR (HOSPITAL ORDER, PERFORMED IN ~~LOC~~ HOSPITAL LAB): SARS Coronavirus 2: NEGATIVE

## 2020-06-11 SURGERY — TURBT (TRANSURETHRAL RESECTION OF BLADDER TUMOR)
Anesthesia: General | Site: Bladder

## 2020-06-11 MED ORDER — PROPOFOL 10 MG/ML IV BOLUS
INTRAVENOUS | Status: DC | PRN
  Administered 2020-06-11: 170 mg via INTRAVENOUS

## 2020-06-11 MED ORDER — PROPOFOL 10 MG/ML IV BOLUS
INTRAVENOUS | Status: AC
  Filled 2020-06-11: qty 20

## 2020-06-11 MED ORDER — SODIUM CHLORIDE 0.9 % IR SOLN
3000.0000 mL | Status: DC
  Administered 2020-06-11 (×2): 3000 mL

## 2020-06-11 MED ORDER — FENTANYL CITRATE (PF) 100 MCG/2ML IJ SOLN
INTRAMUSCULAR | Status: AC
  Filled 2020-06-11: qty 2

## 2020-06-11 MED ORDER — ACETAMINOPHEN 325 MG PO TABS: 325.0000 mg | ORAL_TABLET | Freq: Once | ORAL | Status: DC | PRN

## 2020-06-11 MED ORDER — FENTANYL CITRATE (PF) 250 MCG/5ML IJ SOLN
INTRAMUSCULAR | Status: AC
  Filled 2020-06-11: qty 5

## 2020-06-11 MED ORDER — LACTATED RINGERS IV SOLN
INTRAVENOUS | Status: DC
  Administered 2020-06-11: 1 mL via INTRAVENOUS

## 2020-06-11 MED ORDER — ACETAMINOPHEN 10 MG/ML IV SOLN: 1000.0000 mg | Freq: Once | INTRAVENOUS | Status: DC | PRN

## 2020-06-11 MED ORDER — ONDANSETRON HCL 4 MG/2ML IJ SOLN
INTRAMUSCULAR | Status: AC
  Filled 2020-06-11: qty 2

## 2020-06-11 MED ORDER — MEPERIDINE HCL 50 MG/ML IJ SOLN: 6.2500 mg | INTRAMUSCULAR | Status: DC | PRN

## 2020-06-11 MED ORDER — MIDAZOLAM HCL 2 MG/2ML IJ SOLN
INTRAMUSCULAR | Status: AC
  Filled 2020-06-11: qty 2

## 2020-06-11 MED ORDER — SUGAMMADEX SODIUM 200 MG/2ML IV SOLN
INTRAVENOUS | Status: DC | PRN
  Administered 2020-06-11: 200 mg via INTRAVENOUS

## 2020-06-11 MED ORDER — OXYCODONE HCL 5 MG/5ML PO SOLN: 5.0000 mg | Freq: Once | ORAL | Status: DC | PRN

## 2020-06-11 MED ORDER — FENTANYL CITRATE (PF) 100 MCG/2ML IJ SOLN
25.0000 ug | INTRAMUSCULAR | Status: DC | PRN
  Administered 2020-06-11 (×2): 50 ug via INTRAVENOUS

## 2020-06-11 MED ORDER — DOCUSATE SODIUM 100 MG PO CAPS
100.0000 mg | ORAL_CAPSULE | Freq: Two times a day (BID) | ORAL | Status: DC
  Administered 2020-06-11 – 2020-06-12 (×2): 100 mg via ORAL
  Filled 2020-06-11 (×2): qty 1

## 2020-06-11 MED ORDER — DEXAMETHASONE SODIUM PHOSPHATE 10 MG/ML IJ SOLN
INTRAMUSCULAR | Status: DC | PRN
  Administered 2020-06-11: 10 mg via INTRAVENOUS

## 2020-06-11 MED ORDER — DEXTROSE 5 % IV SOLN
INTRAVENOUS | Status: DC | PRN
  Administered 2020-06-11: 2 g via INTRAVENOUS

## 2020-06-11 MED ORDER — 0.9 % SODIUM CHLORIDE (POUR BTL) OPTIME
TOPICAL | Status: DC | PRN
  Administered 2020-06-11: 1000 mL

## 2020-06-11 MED ORDER — OXYCODONE HCL 5 MG PO TABS: 5.0000 mg | ORAL_TABLET | Freq: Once | ORAL | Status: DC | PRN

## 2020-06-11 MED ORDER — ONDANSETRON HCL 4 MG/2ML IJ SOLN: 4.0000 mg | INTRAMUSCULAR | Status: DC | PRN

## 2020-06-11 MED ORDER — CEFTRIAXONE SODIUM 2 G IJ SOLR
2.0000 g | INTRAMUSCULAR | Status: DC
  Filled 2020-06-11: qty 20

## 2020-06-11 MED ORDER — DEXAMETHASONE SODIUM PHOSPHATE 10 MG/ML IJ SOLN
INTRAMUSCULAR | Status: AC
  Filled 2020-06-11: qty 1

## 2020-06-11 MED ORDER — ONDANSETRON HCL 4 MG/2ML IJ SOLN
INTRAMUSCULAR | Status: DC | PRN
  Administered 2020-06-11: 4 mg via INTRAVENOUS

## 2020-06-11 MED ORDER — ROCURONIUM BROMIDE 10 MG/ML (PF) SYRINGE
PREFILLED_SYRINGE | INTRAVENOUS | Status: DC | PRN
  Administered 2020-06-11: 30 mg via INTRAVENOUS
  Administered 2020-06-11: 10 mg via INTRAVENOUS

## 2020-06-11 MED ORDER — AMISULPRIDE (ANTIEMETIC) 5 MG/2ML IV SOLN: 10.0000 mg | Freq: Once | INTRAVENOUS | Status: DC | PRN

## 2020-06-11 MED ORDER — FENTANYL CITRATE (PF) 100 MCG/2ML IJ SOLN
INTRAMUSCULAR | Status: AC
Start: 2020-06-11 — End: 2020-06-12
  Filled 2020-06-11: qty 2

## 2020-06-11 MED ORDER — LIDOCAINE 2% (20 MG/ML) 5 ML SYRINGE
INTRAMUSCULAR | Status: DC | PRN
  Administered 2020-06-11: 40 mg via INTRAVENOUS

## 2020-06-11 MED ORDER — MORPHINE SULFATE (PF) 4 MG/ML IV SOLN
2.0000 mg | INTRAVENOUS | Status: DC | PRN
  Administered 2020-06-11: 4 mg via INTRAVENOUS
  Filled 2020-06-11 (×2): qty 1

## 2020-06-11 MED ORDER — FENTANYL CITRATE (PF) 100 MCG/2ML IJ SOLN
INTRAMUSCULAR | Status: DC | PRN
Start: 2020-06-11 — End: 2020-06-11
  Administered 2020-06-11: 100 ug via INTRAVENOUS
  Administered 2020-06-11: 50 ug via INTRAVENOUS

## 2020-06-11 MED ORDER — ACETAMINOPHEN 160 MG/5ML PO SOLN: 325.0000 mg | Freq: Once | ORAL | Status: DC | PRN

## 2020-06-11 MED ORDER — OXYCODONE HCL 5 MG PO TABS
5.0000 mg | ORAL_TABLET | ORAL | Status: DC | PRN
  Administered 2020-06-12: 5 mg via ORAL
  Filled 2020-06-11: qty 1

## 2020-06-11 MED ORDER — MIDAZOLAM HCL 5 MG/5ML IJ SOLN
INTRAMUSCULAR | Status: DC | PRN
  Administered 2020-06-11: 2 mg via INTRAVENOUS

## 2020-06-11 MED ORDER — SODIUM CHLORIDE 0.9 % IR SOLN
Status: DC | PRN
  Administered 2020-06-11: 12000 mL via INTRAVESICAL
  Administered 2020-06-11: 9000 mL via INTRAVESICAL

## 2020-06-11 MED ORDER — FENTANYL CITRATE (PF) 100 MCG/2ML IJ SOLN
INTRAMUSCULAR | Status: AC
Start: 2020-06-11 — End: ?
  Filled 2020-06-11: qty 2

## 2020-06-11 MED ORDER — LACTATED RINGERS IV SOLN: INTRAVENOUS | Status: DC

## 2020-06-11 MED ORDER — SUCCINYLCHOLINE CHLORIDE 200 MG/10ML IV SOSY
PREFILLED_SYRINGE | INTRAVENOUS | Status: DC | PRN
  Administered 2020-06-11: 140 mg via INTRAVENOUS

## 2020-06-11 MED ORDER — OXYBUTYNIN CHLORIDE ER 10 MG PO TB24
10.0000 mg | ORAL_TABLET | Freq: Every day | ORAL | Status: DC
  Administered 2020-06-11 – 2020-06-12 (×2): 10 mg via ORAL
  Filled 2020-06-11 (×2): qty 1

## 2020-06-11 MED ORDER — ACETAMINOPHEN 325 MG PO TABS
650.0000 mg | ORAL_TABLET | ORAL | Status: DC | PRN
  Administered 2020-06-12: 650 mg via ORAL
  Filled 2020-06-11: qty 2

## 2020-06-11 SURGICAL SUPPLY — 15 items
BAG URINE DRAIN 2000ML AR STRL (UROLOGICAL SUPPLIES) IMPLANT
BAG URO CATCHER STRL LF (MISCELLANEOUS) ×2 IMPLANT
CATH FOLEY 3WAY 30CC 22FR (CATHETERS) ×2 IMPLANT
CLOTH BEACON ORANGE TIMEOUT ST (SAFETY) ×2 IMPLANT
ELECT COAG BIPOLAR CYL 1.2MMM (ELECTROSURGICAL)
ELECT REM PT RETURN 15FT ADLT (MISCELLANEOUS) ×2 IMPLANT
ELECTRODE COAG BIPLR CYL 1.2MM (ELECTROSURGICAL) IMPLANT
GLOVE BIO SURGEON STRL SZ 6.5 (GLOVE) ×2 IMPLANT
GOWN STRL REUS W/TWL LRG LVL3 (GOWN DISPOSABLE) ×2 IMPLANT
KIT TURNOVER KIT A (KITS) IMPLANT
LOOP CUT BIPOLAR 24F LRG (ELECTROSURGICAL) IMPLANT
MANIFOLD NEPTUNE II (INSTRUMENTS) ×2 IMPLANT
PACK CYSTO (CUSTOM PROCEDURE TRAY) ×2 IMPLANT
TUBING CONNECTING 10 (TUBING) ×2 IMPLANT
TUBING UROLOGY SET (TUBING) ×2 IMPLANT

## 2020-06-11 NOTE — Anesthesia Procedure Notes (Signed)
Procedure Name: Intubation Date/Time: 06/11/2020 4:29 PM Performed by: Cynda Familia, CRNA Pre-anesthesia Checklist: Patient identified, Emergency Drugs available, Suction available and Patient being monitored Patient Re-evaluated:Patient Re-evaluated prior to induction Oxygen Delivery Method: Circle System Utilized Preoxygenation: Pre-oxygenation with 100% oxygen Induction Type: IV induction, Cricoid Pressure applied and Rapid sequence Ventilation: Mask ventilation without difficulty Laryngoscope Size: Miller and 2 Grade View: Grade I Tube type: Oral Tube size: 7.0 mm Number of attempts: 1 Airway Equipment and Method: Stylet Placement Confirmation: ETT inserted through vocal cords under direct vision,  positive ETCO2 and breath sounds checked- equal and bilateral Secured at: 23 cm Tube secured with: Tape Dental Injury: Teeth and Oropharynx as per pre-operative assessment  Comments: Smooth RSI by Smith Robert-- intubation AM CRNA atraumatic-- teeth and mouth as preop bilat BS Hollis-- irregular surfaces on front teeth and unchanged with laryngoscopy

## 2020-06-11 NOTE — Anesthesia Preprocedure Evaluation (Addendum)
Anesthesia Evaluation  Patient identified by MRN, date of birth, ID band Patient awake    Reviewed: Allergy & Precautions, NPO status , Patient's Chart, lab work & pertinent test results  Airway Mallampati: I  TM Distance: >3 FB Neck ROM: Full    Dental  (+) Teeth Intact, Dental Advisory Given   Pulmonary neg pulmonary ROS,    breath sounds clear to auscultation       Cardiovascular negative cardio ROS   Rhythm:Regular Rate:Normal     Neuro/Psych negative neurological ROS  negative psych ROS   GI/Hepatic negative GI ROS, Neg liver ROS,   Endo/Other  negative endocrine ROS  Renal/GU negative Renal ROS     Musculoskeletal   Abdominal Normal abdominal exam  (+)   Peds  Hematology negative hematology ROS (+)   Anesthesia Other Findings   Reproductive/Obstetrics                             Anesthesia Physical Anesthesia Plan  ASA: II  Anesthesia Plan: General   Post-op Pain Management:    Induction: Intravenous  PONV Risk Score and Plan: 3 and Ondansetron, Dexamethasone and Midazolam  Airway Management Planned: Oral ETT  Additional Equipment: None  Intra-op Plan:   Post-operative Plan: Extubation in OR  Informed Consent: I have reviewed the patients History and Physical, chart, labs and discussed the procedure including the risks, benefits and alternatives for the proposed anesthesia with the patient or authorized representative who has indicated his/her understanding and acceptance.       Plan Discussed with: CRNA  Anesthesia Plan Comments: (Lab Results      Component                Value               Date                      WBC                      6.5                 06/11/2020                HGB                      15.1                06/11/2020                HCT                      43.4                06/11/2020                MCV                      87.0                 06/11/2020                PLT                      240                 06/11/2020           )  Anesthesia Quick Evaluation  

## 2020-06-11 NOTE — H&P (Signed)
CC/HPI: cc: gross hematuria   06/11/20: 64 year old man with gross hematuria since July. He denies any flank pain or UTIs. He says he has not had any imaging done. He is having significant urinary symptoms including urinary frequency, dysuria, leakage, straining to urinate. He has never had a kidney stone before. His urine is very dark like red wine in the specimen cup. He does not smoke any tobacco and never has.     ALLERGIES: NKDA    MEDICATIONS: Multivitamin  Prostate [facility]     GU PSH: None   NON-GU PSH: Tonsillectomy - 1965     GU PMH: None   NON-GU PMH: None   FAMILY HISTORY: Prostate Cancer - Father   SOCIAL HISTORY: Marital Status: Single Current Smoking Status: Patient has never smoked.   Tobacco Use Assessment Completed: Used Tobacco in last 30 days? Drinks 8 drinks per year. Types of alcohol consumed: Wine.     REVIEW OF SYSTEMS:    GU Review Male:   Patient denies frequent urination, hard to postpone urination, burning/ pain with urination, get up at night to urinate, leakage of urine, stream starts and stops, trouble starting your stream, have to strain to urinate , erection problems, and penile pain.  Gastrointestinal (Upper):   Patient denies nausea, vomiting, and indigestion/ heartburn.  Gastrointestinal (Lower):   Patient denies diarrhea and constipation.  Constitutional:   Patient denies fever, night sweats, weight loss, and fatigue.  Skin:   Patient reports skin rash/ lesion. Patient denies itching.  Eyes:   Patient denies blurred vision and double vision.  Ears/ Nose/ Throat:   Patient denies sore throat and sinus problems.  Hematologic/Lymphatic:   Patient denies swollen glands and easy bruising.  Cardiovascular:   Patient denies leg swelling and chest pains.  Respiratory:   Patient denies cough and shortness of breath.  Endocrine:   Patient denies excessive thirst.  Musculoskeletal:   Patient denies joint pain and back pain.   Neurological:   Patient denies headaches and dizziness.  Psychologic:   Patient denies depression and anxiety.   VITAL SIGNS:      06/11/2020 08:54 AM  Weight 240 lb / 108.86 kg  Height 72 in / 182.88 cm  BP 128/84 mmHg  Pulse 97 /min  Temperature 97.1 F / 36.1 C  BMI 32.5 kg/m   MULTI-SYSTEM PHYSICAL EXAMINATION:    Constitutional: Well-nourished. No physical deformities. Normally developed. Good grooming.  Neck: Neck symmetrical, not swollen. Normal tracheal position.  Respiratory: No labored breathing, no use of accessory muscles.   Cardiovascular: Normal temperature  Skin: No paleness, no jaundice, no cyanosis. No lesion, no ulcer, no rash.  Neurologic / Psychiatric: Oriented to time, oriented to place, oriented to person. No depression, no anxiety, no agitation.  Gastrointestinal: No rigidity, non obese abdomen.   Eyes: Normal conjunctivae. Normal eyelids.  Ears, Nose, Mouth, and Throat: Left ear no scars, no lesions, no masses. Right ear no scars, no lesions, no masses. Nose no scars, no lesions, no masses. Normal hearing. Normal lips.  Musculoskeletal: Normal gait and station of head and neck.     Complexity of Data:  Records Review:   POC Tool  Urine Test Review:   Urinalysis  X-Ray Review: C.T. Hematuria: Reviewed Films. Reviewed Report. Discussed With Patient. Filling defects noted in bladder consistent with clot 1st tumor burden.    PROCEDURES:         C.T. Hematuria - 74178, H8469      OMNIPAQUE 300  125CC Patient confirmed No Neulasta OnPro Device.            Urinalysis w/Scope - 81001 Dipstick Dipstick Cont'd Micro  Color: Red Bilirubin: Invalid RBC/hpf: Packed/hpf  Appearance: Turbid Ketones: Invalid Cystals: NS (Not Seen)  Specific Gravity: Invalid Blood: Invalid Casts: NS (Not Seen)  pH: Invalid Protein: Invalid Trichomonas: Not Present  Glucose: Invalid Urobilinogen: Invalid Mucous: Not Present    Nitrites: Invalid Epithelial Cells: NS (Not Seen)     Leukocyte Esterase: Invalid Yeast: NS (Not Seen)      Sperm: Not Present    Notes:  unable to quantitate due to packed rbcs unable to quantitate due to packed rbcs           Morphine 4mg  - J2270, 17471 Qty: 2 Adm. By: Jacalyn Lefevre, M.D.  Unit: mg Lot No   Route: IM Exp. Date   Freq: None Mfgr.:   Site: None        Notes:   Injected 2 mg of Morphine into the left buttock per Dr. Claudia Desanctis. Lot # [id number] Exp:10/22   ASSESSMENT:      ICD-10 Details  1 GU:   Gross hematuria - R31.0 Undiagnosed New Problem - 19 French Foley catheter placed and irrigation attempted. No clots were irrigated however urine is moderately red still. Patient did not tolerate clot irrigation well. Based on CT scan there is large organized clot versus tumor. Discussed the risks and benefits of a cystoscopy with clot evacuation of possible TURBT. We discussed doing this today verses next week. Patient would like to proceed immediately. Risks and benefits of this discussed including but element she bleeding, infection, damage to surrounding structures, need for Foley catheter, need for future treatment, pain. Patient has agreed to proceed will be scheduled for surgery later today.   PLAN:           Orders Labs BMP  Lab Notes: stat  X-Rays: C.T. Hematuria With and Without I.V. Contrast

## 2020-06-11 NOTE — Op Note (Signed)
PATIENT:  Kevin Buck  PRE-OPERATIVE DIAGNOSIS: Gross hematuria   POST-OPERATIVE DIAGNOSIS: Bladder mass  PROCEDURE:  Procedure(s): 1. TRANSURETHRAL RESECTION OF BLADDER TUMOR (TURBT) (>5cm.) 2. Cystoscopy with clot evacuation  SURGEON:  Jacalyn Lefevre, MD  ANESTHESIA:   General  EBL:  less than 50 mL  DRAINS: Urethral catheter (22 Fr. 3way  Foley with continuous bladder irrigation)   SPECIMEN:  Bladder tumor  DISPOSITION OF SPECIMEN:  PATHOLOGY  Indication: 64 year old man who presented to the office with gross hematuria for over 1 month found to have significant clot versus tumor on imaging.  Description of operation: The patient was taken to the operating room and administered general anesthesia. They were then placed on the table and moved to the dorsal lithotomy position after which the genitalia was sterilely prepped and draped. An official timeout was then performed.  The 35 French resectoscope with the 30 lens and visual obturator were then passed into the bladder under direct visualization. Urethra appeared normal. The visual obturator was then removed and the Gyrus resectoscope element with 30  lens was then inserted and the bladder was fully and systematically inspected.  The right ureteral orifice was noted to be in the normal anatomic positions; however, the left ureteral orifice was not seen.  Patient was noted to have lateral lobe prostatic enlargement.  Organized clot was seen in the dependent part of the bladder.  It was very difficult to see due to active bleeding.  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  These were then resected with the resectoscope bipolar loop.  The left ureteral orifice was not identified during the case.  Reinspection of the bladder revealed all obvious tumor had been fully resected and there was no evidence of perforation. The Toomey syringe was then used to irrigate the bladder and remove all of the portions  of bladder tumor which were sent to pathology. I then removed the resectoscope.  A 22 French Foley catheter was then inserted in the bladder and continuous bladder irrigation was initiated.     PLAN OF CARE:  Admit for observation with CBI. Continue foley for 7 days postop due to depth of resection.  PATIENT DISPOSITION:  PACU - hemodynamically stable.

## 2020-06-11 NOTE — Transfer of Care (Signed)
Immediate Anesthesia Transfer of Care Note  Patient: Kevin Buck  Procedure(s) Performed: Procedure(s): CYSTO CLOT EVACUATION  TRANSURETHRAL RESECTION OF BLADDER TUMOR (TURBT) (N/A)  Patient Location: PACU  Anesthesia Type:General  Level of Consciousness: Alert, Awake, Oriented  Airway & Oxygen Therapy: Patient Spontanous Breathing  Post-op Assessment: Report given to RN  Post vital signs: Reviewed and stable  Last Vitals:  Vitals:   06/11/20 1406  BP: (!) 144/96  Pulse: 98  Resp: 18  Temp: 36.7 C  SpO2: 09%    Complications: No apparent anesthesia complications

## 2020-06-12 ENCOUNTER — Encounter (HOSPITAL_COMMUNITY): Payer: Self-pay | Admitting: Urology

## 2020-06-12 DIAGNOSIS — Z20822 Contact with and (suspected) exposure to covid-19: Secondary | ICD-10-CM | POA: Diagnosis not present

## 2020-06-12 DIAGNOSIS — R310 Gross hematuria: Secondary | ICD-10-CM | POA: Diagnosis not present

## 2020-06-12 DIAGNOSIS — N3289 Other specified disorders of bladder: Secondary | ICD-10-CM | POA: Diagnosis not present

## 2020-06-12 DIAGNOSIS — C679 Malignant neoplasm of bladder, unspecified: Secondary | ICD-10-CM | POA: Diagnosis not present

## 2020-06-12 DIAGNOSIS — Z79899 Other long term (current) drug therapy: Secondary | ICD-10-CM | POA: Diagnosis not present

## 2020-06-12 DIAGNOSIS — D494 Neoplasm of unspecified behavior of bladder: Secondary | ICD-10-CM | POA: Diagnosis not present

## 2020-06-12 LAB — CBC
HCT: 42.6 % (ref 39.0–52.0)
Hemoglobin: 14.5 g/dL (ref 13.0–17.0)
MCH: 29.7 pg (ref 26.0–34.0)
MCHC: 34 g/dL (ref 30.0–36.0)
MCV: 87.3 fL (ref 80.0–100.0)
Platelets: 221 10*3/uL (ref 150–400)
RBC: 4.88 MIL/uL (ref 4.22–5.81)
RDW: 12 % (ref 11.5–15.5)
WBC: 12.3 10*3/uL — ABNORMAL HIGH (ref 4.0–10.5)
nRBC: 0 % (ref 0.0–0.2)

## 2020-06-12 LAB — BASIC METABOLIC PANEL
Anion gap: 11 (ref 5–15)
BUN: 16 mg/dL (ref 8–23)
CO2: 20 mmol/L — ABNORMAL LOW (ref 22–32)
Calcium: 8.8 mg/dL — ABNORMAL LOW (ref 8.9–10.3)
Chloride: 104 mmol/L (ref 98–111)
Creatinine, Ser: 0.82 mg/dL (ref 0.61–1.24)
GFR calc Af Amer: >60 mL/min (ref >60)
GFR calc non Af Amer: >60 mL/min (ref >60)
Glucose, Bld: 247 mg/dL — ABNORMAL HIGH (ref 70–99)
Potassium: 4.3 mmol/L (ref 3.5–5.1)
Sodium: 135 mmol/L (ref 135–145)

## 2020-06-12 MED ORDER — CHLORHEXIDINE GLUCONATE CLOTH 2 % EX PADS
6.0000 | MEDICATED_PAD | Freq: Every day | CUTANEOUS | Status: DC
  Administered 2020-06-12: 6 via TOPICAL

## 2020-06-12 NOTE — Discharge Instructions (Signed)

## 2020-06-12 NOTE — Plan of Care (Signed)

## 2020-06-12 NOTE — Progress Notes (Signed)
Urology Inpatient Progress Report  Bladder tumor [D49.4]  Procedure(s): CYSTO CLOT EVACUATION  TRANSURETHRAL RESECTION OF BLADDER TUMOR (TURBT)  1 Day Post-Op   Intv/Subj: No acute events overnight.  Patient is without complaint.  No issues with CBI last night and it's clamped off this AM.   Active Problems:   Bladder tumor  Current Facility-Administered Medications  Medication Dose Route Frequency Provider Last Rate Last Admin  . acetaminophen (TYLENOL) tablet 650 mg  650 mg Oral Q4H PRN Jacalyn Lefevre D, MD   650 mg at 06/12/20 0120  . Chlorhexidine Gluconate Cloth 2 % PADS 6 each  6 each Topical Daily Larraine Argo D, MD      . docusate sodium (COLACE) capsule 100 mg  100 mg Oral BID Jacalyn Lefevre D, MD   100 mg at 06/11/20 2121  . morphine 4 MG/ML injection 2-4 mg  2-4 mg Intravenous Q2H PRN Jacalyn Lefevre D, MD   4 mg at 06/11/20 1943  . ondansetron (ZOFRAN) injection 4 mg  4 mg Intravenous Q4H PRN Jacalyn Lefevre D, MD      . oxybutynin (DITROPAN-XL) 24 hr tablet 10 mg  10 mg Oral Daily Jacalyn Lefevre D, MD   10 mg at 06/11/20 2010  . oxyCODONE (Oxy IR/ROXICODONE) immediate release tablet 5 mg  5 mg Oral Q4H PRN Jacalyn Lefevre D, MD   5 mg at 06/12/20 0120  . sodium chloride irrigation 0.9 % 3,000 mL  3,000 mL Irrigation Continuous Jacalyn Lefevre D, MD   3,000 mL at 06/11/20 1758     Objective: Vital: Vitals:   06/11/20 1830 06/11/20 1900 06/11/20 1926 06/12/20 0030  BP: (!) 153/99  (!) 146/84 126/81  Pulse: 83  86 97  Resp: 12  19 16   Temp: (!) 97.4 F (36.3 C) (!) 97.4 F (36.3 C) 98.2 F (36.8 C) 98.4 F (36.9 C)  TempSrc:   Oral Oral  SpO2: 97%  98% 95%  Weight:      Height:       I/Os: I/O last 3 completed shifts: In: 2595 [P.O.:240; I.V.:1350; Other:3100] Out: 5225 [Urine:5175; Blood:50]  Physical Exam:  General: Patient is in no apparent distress Lungs: Normal respiratory effort, chest expands symmetrically. GI: The abdomen is soft and  nontender Foley: 22Fr 3 way foley with pink tinged urine in tubing, CBI clamped off Ext: lower extremities symmetric  Lab Results: Recent Labs    06/11/20 1408 06/12/20 0541  WBC 6.5 12.3*  HGB 15.1 14.5  HCT 43.4 42.6   Recent Labs    06/12/20 0541  NA 135  K 4.3  CL 104  CO2 20*  GLUCOSE 247*  BUN 16  CREATININE 0.82  CALCIUM 8.8*   No results for input(s): LABPT, INR in the last 72 hours. No results for input(s): LABURIN in the last 72 hours. Results for orders placed or performed during the hospital encounter of 06/11/20  SARS Coronavirus 2 by RT PCR (hospital order, performed in [facility] hospital lab) Nasopharyngeal Nasopharyngeal Swab     Status: None   Collection Time: 06/11/20  2:00 PM   Specimen: Nasopharyngeal Swab  Result Value Ref Range Status   SARS Coronavirus 2 NEGATIVE NEGATIVE Final    Comment: (NOTE) SARS-CoV-2 target nucleic acids are NOT DETECTED.  The SARS-CoV-2 RNA is generally detectable in upper and lower respiratory specimens during the acute phase of infection. The lowest concentration of SARS-CoV-2 viral copies this assay can detect is 250 copies / mL. A negative result  does not preclude SARS-CoV-2 infection and should not be used as the sole basis for treatment or other patient management decisions.  A negative result may occur with improper specimen collection / handling, submission of specimen other than nasopharyngeal swab, presence of viral mutation(s) within the areas targeted by this assay, and inadequate number of viral copies (<250 copies / mL). A negative result must be combined with clinical observations, patient history, and epidemiological information.  Fact Sheet for Patients:   StrictlyIdeas.no  Fact Sheet for Healthcare Providers: BankingDealers.co.za  This test is not yet approved or  cleared by the [place] FDA and has been authorized for detection and/or diagnosis  of SARS-CoV-2 by FDA under an Emergency Use Authorization (EUA).  This EUA will remain in effect (meaning this test can be used) for the duration of the COVID-19 declaration under Section 564(b)(1) of the Act, 21 U.S.C. section 360bbb-3(b)(1), unless the authorization is terminated or revoked sooner.  Performed at [facility], [place] [address], [place], [place] [address]     Studies/Results: No results found.  Assessment: 64 yo man who presented with gross hematuria found to have bladder tumor intraoperatively now POD1 s/p cysto, clot evac and TURBT.  -CBI turned off this AM and catheter plugged -plan to d/c home with foley for 1 week -follow up arranged -will discuss pathology at follow up visit -discussed with patient findings of bladder tumor and likely bladder cancer diagnosis   Jacalyn Lefevre, MD Urology 06/12/2020, 7:54 AM

## 2020-06-12 NOTE — Progress Notes (Signed)
No changes from prior nurse's assessment. Will continue to assess patient.

## 2020-06-12 NOTE — Progress Notes (Signed)
Pt to be discharged to home today. Pt given discharge instructions including review of all Medications and schedules for these Medications. Pt to be discharged home with foley catheter. Leg bag and foley bag teaching given to Pt. Pt able to return demonstration of leg bag to foley change. Pt verbalized understanding of all discharge instructions and teaching. Discharge packet with pt at time of discharge

## 2020-06-12 NOTE — Discharge Summary (Signed)
Date of admission: 06/11/2020  Date of discharge: 06/12/2020  Admission diagnosis: gross hematuria  Discharge diagnosis: bladder tumor  Secondary diagnoses:  Patient Active Problem List   Diagnosis Date Noted  . Bladder tumor 06/11/2020    Procedures performed: Procedure(s): CYSTO CLOT EVACUATION  TRANSURETHRAL RESECTION OF BLADDER TUMOR (TURBT)  History and Physical: For full details, please see admission history and physical. Briefly, Kevin Buck is a 64 y.o. year old patient with gross hematuria found to have bladder tumor.   Hospital Course: Patient tolerated the procedure well.  He was then transferred to the floor after an uneventful PACU stay.  His hospital course was uncomplicated.  On POD#1 he had met discharge criteria: was eating a regular diet, was up and ambulating independently,  pain was well controlled, urine was clear with CBI turned off, and was ready to for discharge.   Laboratory values:  Recent Labs    06/11/20 1408 06/12/20 0541  WBC 6.5 12.3*  HGB 15.1 14.5  HCT 43.4 42.6   Recent Labs    06/12/20 0541  NA 135  K 4.3  CL 104  CO2 20*  GLUCOSE 247*  BUN 16  CREATININE 0.82  CALCIUM 8.8*   No results for input(s): LABPT, INR in the last 72 hours. No results for input(s): LABURIN in the last 72 hours. Results for orders placed or performed during the hospital encounter of 06/11/20  SARS Coronavirus 2 by RT PCR (hospital order, performed in [facility] hospital lab) Nasopharyngeal Nasopharyngeal Swab     Status: None   Collection Time: 06/11/20  2:00 PM   Specimen: Nasopharyngeal Swab  Result Value Ref Range Status   SARS Coronavirus 2 NEGATIVE NEGATIVE Final    Comment: (NOTE) SARS-CoV-2 target nucleic acids are NOT DETECTED.  The SARS-CoV-2 RNA is generally detectable in upper and lower respiratory specimens during the acute phase of infection. The lowest concentration of SARS-CoV-2 viral copies this assay can detect is 250 copies /  mL. A negative result does not preclude SARS-CoV-2 infection and should not be used as the sole basis for treatment or other patient management decisions.  A negative result may occur with improper specimen collection / handling, submission of specimen other than nasopharyngeal swab, presence of viral mutation(s) within the areas targeted by this assay, and inadequate number of viral copies (<250 copies / mL). A negative result must be combined with clinical observations, patient history, and epidemiological information.  Fact Sheet for Patients:   StrictlyIdeas.no  Fact Sheet for Healthcare Providers: BankingDealers.co.za  This test is not yet approved or  cleared by the [place] FDA and has been authorized for detection and/or diagnosis of SARS-CoV-2 by FDA under an Emergency Use Authorization (EUA).  This EUA will remain in effect (meaning this test can be used) for the duration of the COVID-19 declaration under Section 564(b)(1) of the Act, 21 U.S.C. section 360bbb-3(b)(1), unless the authorization is terminated or revoked sooner.  Performed at [facility], [place] [address] [place], [place] [address]     Disposition: Home  Discharge instruction: The patient was instructed to be ambulatory but told to refrain from heavy lifting, strenuous activity, or driving.   Discharge medications:    Followup:   Follow-up Information    [facility] On 06/19/2020.   Why: 8:30a Contact information: [place] [phone]

## 2020-06-15 LAB — SURGICAL PATHOLOGY

## 2020-06-17 ENCOUNTER — Other Ambulatory Visit: Payer: Self-pay | Admitting: Urology

## 2020-06-18 MED ORDER — GEMCITABINE CHEMO FOR BLADDER INSTILLATION 2000 MG: 2000.0000 mg | Freq: Once | INTRAVENOUS | Status: AC

## 2020-06-19 DIAGNOSIS — N401 Enlarged prostate with lower urinary tract symptoms: Secondary | ICD-10-CM | POA: Diagnosis not present

## 2020-06-19 DIAGNOSIS — R350 Frequency of micturition: Secondary | ICD-10-CM | POA: Diagnosis not present

## 2020-06-19 DIAGNOSIS — C678 Malignant neoplasm of overlapping sites of bladder: Secondary | ICD-10-CM | POA: Diagnosis not present

## 2020-06-19 DIAGNOSIS — R351 Nocturia: Secondary | ICD-10-CM | POA: Diagnosis not present

## 2020-06-20 NOTE — Anesthesia Postprocedure Evaluation (Signed)
Anesthesia Post Note  Patient: Kevin Buck  Procedure(s) Performed: CYSTO CLOT EVACUATION  TRANSURETHRAL RESECTION OF BLADDER TUMOR (TURBT) (N/A Bladder)     Patient location during evaluation: PACU Anesthesia Type: General Level of consciousness: awake and alert Pain management: pain level controlled Vital Signs Assessment: post-procedure vital signs reviewed and stable Respiratory status: spontaneous breathing, nonlabored ventilation, respiratory function stable and patient connected to nasal cannula oxygen Cardiovascular status: blood pressure returned to baseline and stable Postop Assessment: no apparent nausea or vomiting Anesthetic complications: no   No complications documented.               Effie Berkshire

## 2020-07-10 ENCOUNTER — Other Ambulatory Visit (HOSPITAL_COMMUNITY)
Admission: RE | Admit: 2020-07-10 | Discharge: 2020-07-10 | Disposition: A | Discharge status: Home / Self Care | Payer: BC Managed Care – PPO | Source: Ambulatory Visit | Attending: Urology | Admitting: Urology

## 2020-07-10 ENCOUNTER — Other Ambulatory Visit: Payer: Self-pay

## 2020-07-10 ENCOUNTER — Encounter (HOSPITAL_BASED_OUTPATIENT_CLINIC_OR_DEPARTMENT_OTHER): Payer: Self-pay | Admitting: Urology

## 2020-07-10 DIAGNOSIS — Z20822 Contact with and (suspected) exposure to covid-19: Secondary | ICD-10-CM | POA: Diagnosis not present

## 2020-07-10 DIAGNOSIS — Z01812 Encounter for preprocedural laboratory examination: Secondary | ICD-10-CM | POA: Diagnosis not present

## 2020-07-10 LAB — SARS CORONAVIRUS 2 (TAT 6-24 HRS): SARS Coronavirus 2: NEGATIVE

## 2020-07-10 NOTE — Progress Notes (Signed)
Spoke w/ via phone for pre-op interview---pt Lab needs dos----   none            Lab results------none COVID test ------07-10-20 805 Arrive at -------700 am 07-14-20  NPO after MN NO Solid Food.  Clear liquids from MN until---600 am then npo Medications to take morning of surgery -----none Diabetic medication -----n/a Patient Special Instructions -----none Pre-Op special Istructions -----none Patient verbalized understanding of instructions that were given at this phone interview. Patient denies shortness of breath, chest pain, fever, cough at this phone interview.

## 2020-07-13 NOTE — H&P (Signed)
CC/HPI: cc: bladder cancer postop   06/11/20: 64 year old man with gross hematuria since July. He denies any flank pain or UTIs. He says he has not had any imaging done. He is having significant urinary symptoms including urinary frequency, dysuria, leakage, straining to urinate. He has never had a kidney stone before. His urine is very dark like red wine in the specimen cup. He does not smoke any tobacco and never has.    06/19/20: 64 year old male who presented with painless gross hematuria last week found to have large clot in bladder associated with bladder tumor. He has taken to the operating room and underwent a TURBT with clot evacuation. He returns today for Foley catheter removal and pathology results. 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 Patient also has lower urinary tract symptoms including daytime frequency and nocturia 3 times.     ALLERGIES: NKDA    MEDICATIONS: Oxybutynin Chloride 5 mg tablet 1 tablet PO Q 8 H PRN bladder spasms while foley in place  Multivitamin  Prostate Plus [facility] Complex     GU PSH: Locm 300-399Mg /Ml Iodine,1Ml - 06/11/2020     NON-GU PSH: Tonsillectomy - 1965     GU PMH: Gross hematuria, 22 French Foley catheter placed and irrigation attempted. No clots were irrigated however urine is moderately red still. Patient did not tolerate clot irrigation well. Based on CT scan there is large organized clot versus tumor. Discussed the risks and benefits of a cystoscopy with clot evacuation of possible TURBT. We discussed doing this today verses next week. Patient would like to proceed immediately. Risks and benefits of this discussed including but element she bleeding, infection, damage to surrounding structures, need for Foley catheter, need for future treatment, pain. Patient has agreed to proceed will be scheduled for surgery later today. - 06/11/2020    NON-GU PMH: No Non-GU PMH    FAMILY HISTORY: Prostate Cancer - Father   SOCIAL  HISTORY: Marital Status: Single Current Smoking Status: Patient has never smoked.   Tobacco Use Assessment Completed: Used Tobacco in last 30 days? Drinks 8 drinks per year. Types of alcohol consumed: Wine.     REVIEW OF SYSTEMS:    GU Review Male:   Patient denies frequent urination, hard to postpone urination, burning/ pain with urination, get up at night to urinate, leakage of urine, stream starts and stops, trouble starting your stream, have to strain to urinate , erection problems, and penile pain.  Gastrointestinal (Upper):   Patient denies nausea, vomiting, and indigestion/ heartburn.  Gastrointestinal (Lower):   Patient denies diarrhea and constipation.  Constitutional:   Patient denies fever, night sweats, weight loss, and fatigue.  Skin:   Patient denies skin rash/ lesion and itching.  Eyes:   Patient denies blurred vision and double vision.  Ears/ Nose/ Throat:   Patient denies sore throat and sinus problems.  Hematologic/Lymphatic:   Patient denies swollen glands and easy bruising.  Cardiovascular:   Patient denies leg swelling and chest pains.  Respiratory:   Patient denies cough and shortness of breath.  Endocrine:   Patient denies excessive thirst.  Musculoskeletal:   Patient denies back pain and joint pain.  Neurological:   Patient denies dizziness and headaches.  Psychologic:   Patient denies depression and anxiety.   VITAL SIGNS:      06/19/2020 08:35 AM  Weight 235 lb / 106.59 kg  Height 72 in / 182.88 cm  BP 134/86 mmHg  Pulse 92 /min  Temperature 97.5 F / 36.3 C  BMI 31.9 kg/m   MULTI-SYSTEM PHYSICAL EXAMINATION:    Constitutional: Well-nourished. No physical deformities. Normally developed. Good grooming.  Neck: Neck symmetrical, not swollen. Normal tracheal position.  Respiratory: No labored breathing, no use of accessory muscles.   Cardiovascular: Normal temperature  Skin: No paleness, no jaundice, no cyanosis. No lesion, no ulcer, no rash.  Neurologic /  Psychiatric: Oriented to time, oriented to place, oriented to person. No depression, no anxiety, no agitation.  Gastrointestinal: No rigidity, non obese abdomen.   Eyes: Normal conjunctivae. Normal eyelids.  Ears, Nose, Mouth, and Throat: Left ear no scars, no lesions, no masses. Right ear no scars, no lesions, no masses. Nose no scars, no lesions, no masses. Normal hearing. Normal lips.  Musculoskeletal: Normal gait and station of head and neck.     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 Muscle was present but not involved. Due to pathology as well as overall tumor burden patient will be scheduled for restaging TURBT and about 3 weeks. We have already discussed the need for postoperative induction BCG if repeat pathology confirms high-grade T1. Risks and benefits of TURBT including but not limited to bleeding, pain, bladder perforation, damage to surrounding structures including urethra/prostate/ureteral orifice sees, need for Foley catheter, need for future treatment, pain all discussed with patient. Surgery scheduled for 07/14/2020.  2   BPH w/LUTS - N40.1 Chronic, Stable - Will also try tamsulosin 0.4 mg daily to see if this helps his symptoms.   3   Urinary Frequency - R35.0 Chronic, Stable  4   Nocturia - R35.1 Chronic, Stable

## 2020-07-14 ENCOUNTER — Encounter (HOSPITAL_BASED_OUTPATIENT_CLINIC_OR_DEPARTMENT_OTHER)
Admission: RE | Disposition: A | Discharge status: Home / Self Care | Payer: Self-pay | Source: Home / Self Care | Attending: Urology

## 2020-07-14 ENCOUNTER — Ambulatory Visit (HOSPITAL_BASED_OUTPATIENT_CLINIC_OR_DEPARTMENT_OTHER): Payer: BC Managed Care – PPO | Admitting: Anesthesiology

## 2020-07-14 ENCOUNTER — Encounter (HOSPITAL_BASED_OUTPATIENT_CLINIC_OR_DEPARTMENT_OTHER): Payer: Self-pay | Admitting: Urology

## 2020-07-14 ENCOUNTER — Other Ambulatory Visit: Payer: Self-pay

## 2020-07-14 ENCOUNTER — Ambulatory Visit (HOSPITAL_BASED_OUTPATIENT_CLINIC_OR_DEPARTMENT_OTHER)
Admission: RE | Admit: 2020-07-14 | Discharge: 2020-07-14 | Disposition: A | Discharge status: Home / Self Care | Payer: BC Managed Care – PPO | Attending: Urology | Admitting: Urology

## 2020-07-14 DIAGNOSIS — C61 Malignant neoplasm of prostate: Secondary | ICD-10-CM | POA: Diagnosis not present

## 2020-07-14 DIAGNOSIS — C678 Malignant neoplasm of overlapping sites of bladder: Secondary | ICD-10-CM | POA: Insufficient documentation

## 2020-07-14 DIAGNOSIS — N401 Enlarged prostate with lower urinary tract symptoms: Secondary | ICD-10-CM | POA: Diagnosis not present

## 2020-07-14 DIAGNOSIS — C675 Malignant neoplasm of bladder neck: Secondary | ICD-10-CM | POA: Diagnosis not present

## 2020-07-14 DIAGNOSIS — Z79899 Other long term (current) drug therapy: Secondary | ICD-10-CM | POA: Diagnosis not present

## 2020-07-14 DIAGNOSIS — C679 Malignant neoplasm of bladder, unspecified: Secondary | ICD-10-CM | POA: Diagnosis not present

## 2020-07-14 DIAGNOSIS — Z8042 Family history of malignant neoplasm of prostate: Secondary | ICD-10-CM | POA: Insufficient documentation

## 2020-07-14 DIAGNOSIS — R300 Dysuria: Secondary | ICD-10-CM | POA: Insufficient documentation

## 2020-07-14 DIAGNOSIS — R350 Frequency of micturition: Secondary | ICD-10-CM | POA: Diagnosis not present

## 2020-07-14 DIAGNOSIS — R310 Gross hematuria: Secondary | ICD-10-CM | POA: Insufficient documentation

## 2020-07-14 DIAGNOSIS — R351 Nocturia: Secondary | ICD-10-CM | POA: Insufficient documentation

## 2020-07-14 HISTORY — PX: TRANSURETHRAL RESECTION OF BLADDER TUMOR WITH MITOMYCIN-C: SHX6459

## 2020-07-14 HISTORY — DX: Neoplasm of unspecified behavior of bladder: D49.4

## 2020-07-14 SURGERY — TRANSURETHRAL RESECTION OF BLADDER TUMOR WITH MITOMYCIN-C
Anesthesia: General | Site: Bladder

## 2020-07-14 MED ORDER — MIDAZOLAM HCL 2 MG/2ML IJ SOLN: 0.5000 mg | Freq: Once | INTRAMUSCULAR | Status: DC | PRN

## 2020-07-14 MED ORDER — SODIUM CHLORIDE 0.9 % IR SOLN
Status: DC | PRN
  Administered 2020-07-14 (×5): 3000 mL

## 2020-07-14 MED ORDER — DEXAMETHASONE SODIUM PHOSPHATE 10 MG/ML IJ SOLN
INTRAMUSCULAR | Status: AC
  Filled 2020-07-14: qty 1

## 2020-07-14 MED ORDER — MEPERIDINE HCL 25 MG/ML IJ SOLN: 6.2500 mg | INTRAMUSCULAR | Status: DC | PRN

## 2020-07-14 MED ORDER — CIPROFLOXACIN IN D5W 400 MG/200ML IV SOLN
INTRAVENOUS | Status: AC
  Filled 2020-07-14: qty 200

## 2020-07-14 MED ORDER — DEXAMETHASONE SODIUM PHOSPHATE 10 MG/ML IJ SOLN
INTRAMUSCULAR | Status: DC | PRN
  Administered 2020-07-14: 10 mg via INTRAVENOUS

## 2020-07-14 MED ORDER — PROPOFOL 10 MG/ML IV BOLUS
INTRAVENOUS | Status: AC
  Filled 2020-07-14: qty 20

## 2020-07-14 MED ORDER — HYDROMORPHONE HCL 1 MG/ML IJ SOLN: 0.2500 mg | INTRAMUSCULAR | Status: DC | PRN

## 2020-07-14 MED ORDER — PHENAZOPYRIDINE HCL 100 MG PO TABS: 100.0000 mg | ORAL_TABLET | Freq: Three times a day (TID) | ORAL | 0 refills | Status: DC | PRN

## 2020-07-14 MED ORDER — ONDANSETRON HCL 4 MG/2ML IJ SOLN
INTRAMUSCULAR | Status: DC | PRN
  Administered 2020-07-14: 4 mg via INTRAVENOUS

## 2020-07-14 MED ORDER — PROPOFOL 10 MG/ML IV BOLUS
INTRAVENOUS | Status: DC | PRN
  Administered 2020-07-14: 200 mg via INTRAVENOUS

## 2020-07-14 MED ORDER — ONDANSETRON HCL 4 MG/2ML IJ SOLN
INTRAMUSCULAR | Status: AC
  Filled 2020-07-14: qty 2

## 2020-07-14 MED ORDER — OXYCODONE HCL 5 MG PO TABS
5.0000 mg | ORAL_TABLET | Freq: Once | ORAL | Status: AC | PRN
  Administered 2020-07-14: 5 mg via ORAL

## 2020-07-14 MED ORDER — ACETAMINOPHEN 325 MG PO TABS
ORAL_TABLET | ORAL | Status: DC | PRN
  Administered 2020-07-14: 1000 mg via ORAL

## 2020-07-14 MED ORDER — OXYCODONE HCL 5 MG PO TABS: 5.0000 mg | ORAL_TABLET | Freq: Three times a day (TID) | ORAL | 0 refills | Status: DC | PRN

## 2020-07-14 MED ORDER — FENTANYL CITRATE (PF) 100 MCG/2ML IJ SOLN
INTRAMUSCULAR | Status: AC
  Filled 2020-07-14: qty 2

## 2020-07-14 MED ORDER — CIPROFLOXACIN IN D5W 400 MG/200ML IV SOLN
400.0000 mg | INTRAVENOUS | Status: AC
  Administered 2020-07-14: 400 mg via INTRAVENOUS

## 2020-07-14 MED ORDER — LIDOCAINE 2% (20 MG/ML) 5 ML SYRINGE
INTRAMUSCULAR | Status: DC | PRN
  Administered 2020-07-14: 40 mg via INTRAVENOUS

## 2020-07-14 MED ORDER — ACETAMINOPHEN 500 MG PO TABS
ORAL_TABLET | ORAL | Status: AC
  Filled 2020-07-14: qty 2

## 2020-07-14 MED ORDER — MIDAZOLAM HCL 2 MG/2ML IJ SOLN
INTRAMUSCULAR | Status: AC
  Filled 2020-07-14: qty 2

## 2020-07-14 MED ORDER — OXYCODONE HCL 5 MG PO TABS
ORAL_TABLET | ORAL | Status: AC
  Filled 2020-07-14: qty 1

## 2020-07-14 MED ORDER — LIDOCAINE 2% (20 MG/ML) 5 ML SYRINGE
INTRAMUSCULAR | Status: AC
  Filled 2020-07-14: qty 5

## 2020-07-14 MED ORDER — PROMETHAZINE HCL 25 MG/ML IJ SOLN: 6.2500 mg | INTRAMUSCULAR | Status: DC | PRN

## 2020-07-14 MED ORDER — LACTATED RINGERS IV SOLN: INTRAVENOUS | Status: DC

## 2020-07-14 MED ORDER — FENTANYL CITRATE (PF) 100 MCG/2ML IJ SOLN
INTRAMUSCULAR | Status: DC | PRN
Start: 2020-07-14 — End: 2020-07-14
  Administered 2020-07-14 (×3): 50 ug via INTRAVENOUS

## 2020-07-14 MED ORDER — MIDAZOLAM HCL 2 MG/2ML IJ SOLN
INTRAMUSCULAR | Status: DC | PRN
  Administered 2020-07-14: 2 mg via INTRAVENOUS

## 2020-07-14 MED ORDER — OXYCODONE HCL 5 MG/5ML PO SOLN: 5.0000 mg | Freq: Once | ORAL | Status: AC | PRN

## 2020-07-14 SURGICAL SUPPLY — 23 items
BAG DRAIN URO-CYSTO SKYTR STRL (DRAIN) ×2 IMPLANT
BAG DRN RND TRDRP ANRFLXCHMBR (UROLOGICAL SUPPLIES) ×1
BAG DRN UROCATH (DRAIN) ×1
BAG URINE DRAIN 2000ML AR STRL (UROLOGICAL SUPPLIES) ×2 IMPLANT
CATH COUDE FOLEY 2W 5CC 20FR (CATHETERS) ×2 IMPLANT
CLOTH BEACON ORANGE TIMEOUT ST (SAFETY) ×2 IMPLANT
DRSG TELFA 3X8 NADH (GAUZE/BANDAGES/DRESSINGS) ×2 IMPLANT
ELECT REM PT RETURN 9FT ADLT (ELECTROSURGICAL)
ELECTRODE REM PT RTRN 9FT ADLT (ELECTROSURGICAL) IMPLANT
GLOVE BIO SURGEON STRL SZ 6.5 (GLOVE) ×2 IMPLANT
GLOVE BIO SURGEON STRL SZ7 (GLOVE) ×2 IMPLANT
GLOVE BIOGEL PI IND STRL 7.0 (GLOVE) ×2 IMPLANT
GLOVE BIOGEL PI INDICATOR 7.0 (GLOVE) ×2
GOWN STRL REUS W/TWL LRG LVL3 (GOWN DISPOSABLE) ×4 IMPLANT
HOLDER FOLEY CATH W/STRAP (MISCELLANEOUS) ×2 IMPLANT
IV NS IRRIG 3000ML ARTHROMATIC (IV SOLUTION) ×10 IMPLANT
KIT TURNOVER CYSTO (KITS) IMPLANT
LOOP CUT BIPOLAR 24F LRG (ELECTROSURGICAL) ×2 IMPLANT
MANIFOLD NEPTUNE II (INSTRUMENTS) ×2 IMPLANT
PACK CYSTO (CUSTOM PROCEDURE TRAY) ×2 IMPLANT
TUBE CONNECTING 12X1/4 (SUCTIONS) ×2 IMPLANT
TUBING UROLOGY SET (TUBING) ×2 IMPLANT
WATER STERILE IRR 500ML POUR (IV SOLUTION) ×2 IMPLANT

## 2020-07-14 NOTE — Transfer of Care (Signed)
°  Last Vitals:  Vitals Value Taken Time  BP 132/93 07/14/20 1119  Temp    Pulse 80 07/14/20 1121  Resp 12 07/14/20 1121  SpO2 97 % 07/14/20 1121  Vitals shown include unvalidated device data.  Last Pain:  Vitals:   07/14/20 0739  TempSrc: Oral  PainSc: 0-No pain      Patients Stated Pain Goal: 1 (07/14/20 0739)  Immediate Anesthesia Transfer of Care Note  Patient: Kevin Buck  Procedure(s) Performed: Procedure(s) (LRB): RESTAGING TRANSURETHRAL RESECTION OF BLADDER TUMOR (N/A)  Patient Location: PACU  Anesthesia Type: General  Level of Consciousness: sleepy  Airway & Oxygen Therapy: Patient Spontanous Breathing and Patient connected to face mask oxygen, oral airway remaining.  Post-op Assessment: Report given to PACU RN and Post -op Vital signs reviewed and stable  Post vital signs: Reviewed and stable  Complications: No apparent anesthesia complications

## 2020-07-14 NOTE — Anesthesia Preprocedure Evaluation (Addendum)
Anesthesia Evaluation  Patient identified by MRN, date of birth, ID band Patient awake    Reviewed: Allergy & Precautions, NPO status , Patient's Chart, lab work & pertinent test results  History of Anesthesia Complications Negative for: history of anesthetic complications  Airway Mallampati: I  TM Distance: >3 FB Neck ROM: Full    Dental  (+) Dental Advisory Given   Pulmonary neg pulmonary ROS,  07/10/2020 SARS coronavirus NEG   breath sounds clear to auscultation       Cardiovascular negative cardio ROS   Rhythm:Regular Rate:Normal     Neuro/Psych negative neurological ROS     GI/Hepatic negative GI ROS, Neg liver ROS,   Endo/Other  obese  Renal/GU negative Renal ROS   Bladder tumor    Musculoskeletal   Abdominal (+) + obese,   Peds  Hematology negative hematology ROS (+)   Anesthesia Other Findings   Reproductive/Obstetrics                            Anesthesia Physical Anesthesia Plan  ASA: II  Anesthesia Plan: General   Post-op Pain Management:    Induction: Intravenous  PONV Risk Score and Plan: 2 and Ondansetron and Dexamethasone  Airway Management Planned: LMA  Additional Equipment: None  Intra-op Plan:   Post-operative Plan:   Informed Consent: I have reviewed the patients History and Physical, chart, labs and discussed the procedure including the risks, benefits and alternatives for the proposed anesthesia with the patient or authorized representative who has indicated his/her understanding and acceptance.     Dental advisory given  Plan Discussed with: CRNA and Surgeon  Anesthesia Plan Comments:        Anesthesia Quick Evaluation

## 2020-07-14 NOTE — Discharge Instructions (Signed)
Post Anesthesia Home Care Instructions  Activity: Get plenty of rest for the remainder of the day. A responsible individual must stay with you for 24 hours following the procedure.  For the next 24 hours, DO NOT: -Drive a car -Paediatric nurse -Drink alcoholic beverages -Take any medication unless instructed by your physician -Make any legal decisions or sign important papers.  Meals: Start with liquid foods such as gelatin or soup. Progress to regular foods as tolerated. Avoid greasy, spicy, heavy foods. If nausea and/or vomiting occur, drink only clear liquids until the nausea and/or vomiting subsides. Call your physician if vomiting continues.  Special Instructions/Symptoms: Your throat may feel dry or sore from the anesthesia or the breathing tube placed in your throat during surgery. If this causes discomfort, gargle with warm salt water. The discomfort should disappear within 24 hours.  If you had a scopolamine patch placed behind your ear for the management of post- operative nausea and/or vomiting:  1. The medication in the patch is effective for 72 hours, after which it should be removed.  Wrap patch in a tissue and discard in the trash. Wash hands thoroughly with soap and water. 2. You may remove the patch earlier than 72 hours if you experience unpleasant side effects which may include dry mouth, dizziness or visual disturbances. 3. Avoid touching the patch. Wash your hands with soap and water after contact with the patch.     Indwelling Urinary Catheter Care, Adult An indwelling urinary catheter is a thin tube that is put into your bladder. The tube helps to drain pee (urine) out of your body. The tube goes in through your urethra. Your urethra is where pee comes out of your body. Your pee will come out through the catheter, then it will go into a bag (drainage bag). Take good care of your catheter so it will work well. How to wear your catheter and bag Supplies  needed  Sticky tape (adhesive tape) or a leg strap.  Alcohol wipe or soap and water (if you use tape).  A clean towel (if you use tape).  Large overnight bag.  Smaller bag (leg bag). Wearing your catheter Attach your catheter to your leg with tape or a leg strap.  Make sure the catheter is not pulled tight.  If a leg strap gets wet, take it off and put on a dry strap.  If you use tape to hold the bag on your leg: 1. Use an alcohol wipe or soap and water to wash your skin where the tape made it sticky before. 2. Use a clean towel to pat-dry that skin. 3. Use new tape to make the bag stay on your leg. Wearing your bags You should have been given a large overnight bag.  You may wear the overnight bag in the day or night.  Always have the overnight bag lower than your bladder.  Do not let the bag touch the floor.  Before you go to sleep, put a clean plastic bag in a wastebasket. Then hang the overnight bag inside the wastebasket. You should also have a smaller leg bag that fits under your clothes.  Always wear the leg bag below your knee.  Do not wear your leg bag at night. How to care for your skin and catheter Supplies needed  A clean washcloth.  Water and mild soap.  A clean towel. Caring for your skin and catheter      Clean the skin around your catheter every day: 1.  Wash your hands with soap and water. 2. Wet a clean washcloth in warm water and mild soap. 3. Clean the skin around your urethra.  If you are male:  Gently spread the folds of skin around your vagina (labia).  With the washcloth in your other hand, wipe the inner side of your labia on each side. Wipe from front to back.  If you are male:  Pull back any skin that covers the end of your penis (foreskin).  With the washcloth in your other hand, wipe your penis in small circles. Start wiping at the tip of your penis, then move away from the catheter.  Move the foreskin back in place, if  needed. 4. With your free hand, hold the catheter close to where it goes into your body.  Keep holding the catheter during cleaning so it does not get pulled out. 5. With the washcloth in your other hand, clean the catheter.  Only wipe downward on the catheter.  Do not wipe upward toward your body. Doing this may push germs into your urethra and cause infection. 6. Use a clean towel to pat-dry the catheter and the skin around it. Make sure to wipe off all soap. 7. Wash your hands with soap and water.  Shower every day. Do not take baths.  Do not use cream, ointment, or lotion on the area where the catheter goes into your body, unless your doctor tells you to.  Do not use powders, sprays, or lotions on your genital area.  Check your skin around the catheter every day for signs of infection. Check for: ? Redness, swelling, or pain. ? Fluid or blood. ? Warmth. ? Pus or a bad smell. How to empty the bag Supplies needed  Rubbing alcohol.  Gauze pad or cotton ball.  Tape or a leg strap. Emptying the bag Pour the pee out of your bag when it is ?- full, or at least 2-3 times a day. Do this for your overnight bag and your leg bag. 1. Wash your hands with soap and water. 2. Separate (detach) the bag from your leg. 3. Hold the bag over the toilet or a clean pail. Keep the bag lower than your hips and bladder. This is so the pee (urine) does not go back into the tube. 4. Open the pour spout. It is at the bottom of the bag. 5. Empty the pee into the toilet or pail. Do not let the pour spout touch any surface. 6. Put rubbing alcohol on a gauze pad or cotton ball. 7. Use the gauze pad or cotton ball to clean the pour spout. 8. Close the pour spout. 9. Attach the bag to your leg with tape or a leg strap. 10. Wash your hands with soap and water. Follow instructions for cleaning the drainage bag:  From the product maker.  As told by your doctor. How to change the bag Supplies  needed  Alcohol wipes.  A clean bag.  Tape or a leg strap. Changing the bag Replace your bag when it starts to leak, smell bad, or look dirty. 1. Wash your hands with soap and water. 2. Separate the dirty bag from your leg. 3. Pinch the catheter with your fingers so that pee does not spill out. 4. Separate the catheter tube from the bag tube where these tubes connect (at the connection valve). Do not let the tubes touch any surface. 5. Clean the end of the catheter tube with an alcohol wipe. Use a  different alcohol wipe to clean the end of the bag tube. 6. Connect the catheter tube to the tube of the clean bag. 7. Attach the clean bag to your leg with tape or a leg strap. Do not make the bag tight on your leg. 8. Wash your hands with soap and water. General rules   Never pull on your catheter. Never try to take it out. Doing that can hurt you.  Always wash your hands before and after you touch your catheter or bag. Use a mild, fragrance-free soap. If you do not have soap and water, use hand sanitizer.  Always make sure there are no twists or bends (kinks) in the catheter tube.  Always make sure there are no leaks in the catheter or bag.  Drink enough fluid to keep your pee pale yellow.  Do not take baths, swim, or use a hot tub.  If you are male, wipe from front to back after you poop (have a bowel movement). Contact a doctor if:  Your pee is cloudy.  Your pee smells worse than usual.  Your catheter gets clogged.  Your catheter leaks.  Your bladder feels full. Get help right away if:  You have redness, swelling, or pain where the catheter goes into your body.  You have fluid, blood, pus, or a bad smell coming from the area where the catheter goes into your body.  Your skin feels warm where the catheter goes into your body.  You have a fever.  You have pain in your: ? Belly (abdomen). ? Legs. ? Lower back. ? Bladder.  You see blood in the catheter.  Your  pee is pink or red.  You feel sick to your stomach (nauseous).  You throw up (vomit).  You have chills.  Your pee is not draining into the bag.  Your catheter gets pulled out. Summary  An indwelling urinary catheter is a thin tube that is placed into the bladder to help drain pee (urine) out of the body.  The catheter is placed into the part of the body that drains pee from the bladder (urethra).  Taking good care of your catheter will keep it working properly and help prevent problems.  Always wash your hands before and after touching your catheter or bag.  Never pull on your catheter or try to take it out. This information is not intended to replace advice given to you by your health care provider. Make sure you discuss any questions you have with your health care provider. Document Revised: 01/25/2019 Document Reviewed: 05/19/2017 Elsevier Patient Education  [place]. Transurethral Resection of Bladder Tumor (TURBT) or Bladder Biopsy   Definition:  Transurethral Resection of the Bladder Tumor is a surgical procedure used to diagnose and remove tumors within the bladder. TURBT is the most common treatment for early stage bladder cancer.  General instructions:     Your recent bladder surgery requires very little post hospital care but some definite precautions.  Despite the fact that no skin incisions were used, the area around the bladder incisions are raw and covered with scabs to promote healing and prevent bleeding. Certain precautions are needed to insure that the scabs are not disturbed over the next 2-4 weeks while the healing proceeds.  Because the raw surface inside your bladder and the irritating effects of urine you may expect frequency of urination and/or urgency (a stronger desire to urinate) and perhaps even getting up at night more often. This will usually resolve or  improve slowly over the healing period. You may see some blood in your urine over the  first 6 weeks. Do not be alarmed, even if the urine was clear for a while. Get off your feet and drink lots of fluids until clearing occurs. If you start to pass clots or don't improve call us.  Diet:  You may return to your normal diet immediately. Because of the raw surface of your bladder, alcohol, spicy foods, foods high in acid and drinks with caffeine may cause irritation or frequency and should be used in moderation. To keep your urine flowing freely and avoid constipation, drink plenty of fluids during the day (8-10 glasses). Tip: Avoid cranberry juice because it is very acidic.  Activity:  Your physical activity doesn't need to be restricted. However, if you are very active, you may see some blood in the urine. We suggest that you reduce your activity under the circumstances until the bleeding has stopped.  Bowels:  It is important to keep your bowels regular during the postoperative period. Straining with bowel movements can cause bleeding. A bowel movement every other day is reasonable. Use a mild laxative if needed, such as milk of magnesia 2-3 tablespoons, or 2 Dulcolax tablets. Call if you continue to have problems. If you had been taking narcotics for pain, before, during or after your surgery, you may be constipated. Take a laxative if necessary.    Medication:  You should resume your pre-surgery medications unless told not to. In addition you may be given an antibiotic to prevent or treat infection. Antibiotics are not always necessary. All medication should be taken as prescribed until the bottles are finished unless you are having an unusual reaction to one of the drugs.

## 2020-07-14 NOTE — Anesthesia Postprocedure Evaluation (Signed)
Anesthesia Post Note  Patient: Kevin Buck  Procedure(s) Performed: RESTAGING TRANSURETHRAL RESECTION OF BLADDER TUMOR (N/A Bladder)     Patient location during evaluation: PACU Anesthesia Type: General Level of consciousness: awake and alert, patient cooperative and oriented Pain management: pain level controlled Vital Signs Assessment: post-procedure vital signs reviewed and stable Respiratory status: spontaneous breathing, nonlabored ventilation and respiratory function stable Cardiovascular status: blood pressure returned to baseline and stable Postop Assessment: no apparent nausea or vomiting, adequate PO intake and able to ambulate Anesthetic complications: no   No complications documented.  Last Vitals:  Vitals:   07/14/20 1215 07/14/20 1320  BP: (!) 148/97 (!) 152/94  Pulse: 84 92  Resp:  20  Temp:    SpO2: 94% 96%    Last Pain:  Vitals:   07/14/20 1130  TempSrc:   PainSc: 0-No pain                 Lala Been,E. Teodoro Jeffreys

## 2020-07-14 NOTE — Interval H&P Note (Signed)
History and Physical Interval Note:  07/14/2020 10:07 AM  Kevin Buck  has presented today for surgery, with the diagnosis of BLADDER CANCER.  The various methods of treatment have been discussed with the patient and family. After consideration of risks, benefits and other options for treatment, the patient has consented to  Procedure(s): TRANSURETHRAL RESECTION OF BLADDER TUMOR WITH GEMCITABINE (N/A) as a surgical intervention.  The patient's history has been reviewed, patient examined, no change in status, stable for surgery.  I have reviewed the patient's chart and labs.  Questions were answered to the patient's satisfaction.     [name] D Shayleigh Bouldin

## 2020-07-14 NOTE — Op Note (Signed)
PATIENT:  Kevin Buck  PRE-OPERATIVE DIAGNOSIS: Bladder cancer  POST-OPERATIVE DIAGNOSIS: Same  PROCEDURE:  Procedure(s): 1. Restaging TRANSURETHRAL RESECTION OF BLADDER TUMOR (TURBT) (3cm.)   SURGEON:  Jacalyn Lefevre, MD  ANESTHESIA:   General  EBL: <10 mL  DRAINS: Urethral catheter (20 Fr. Coude Foley)   SPECIMEN:   1.  Anterior bladder neck tumor 2.  Residual bladder tumor  DISPOSITION OF SPECIMEN:  PATHOLOGY  Indication: 64 year old man who initially presented with gross hematuria and urinary retention found to have significant bladder tumor burden and underwent initial TURBT approximately 4 weeks ago.  Pathology revealed high-grade T1 he now returns for restaging TURBT.  Description of operation: The patient was taken to the operating room and administered general anesthesia. They were then placed on the table and moved to the dorsal lithotomy position after which the genitalia was sterilely prepped and draped. An official timeout was then performed.  The 56 French resectoscope with the 30 lens and visual obturator were then passed into the bladder under direct visualization. Urethra appeared normal.  Patient had significant trilobar prostatic enlargement.  The visual obturator was then removed and the Gyrus resectoscope element with 30  lens was then inserted and the bladder was fully and systematically inspected.  The right ureteral orifice was clearly identified however the left ureteral orifice was difficult to visualize due to significant edema and nearby tumor bed.  There was a small opening visible and a sensor wire was used to cannulate it which confirmed that it was the ureteral orifice.  It was not resected during the case today.  The prior resection bed had necrotic calcified tissue that was easily scraped off with the loop.  There appeared to be some residual abnormal bladder mucosa on the left lateral wall adjacent to the resection bed as well as the anterior  bladder neck.  Both of these areas were resected and sent to pathology.  Reinspection of the bladder revealed all obvious tumor had been fully resected and there was no evidence of perforation.  Hemostasis was adequate with the irrigation turned off.  I then removed the resectoscope.  A 20 French Foley catheter was then inserted in the bladder and irrigated.  The patient was awakened and taken to the recovery room.   PLAN OF CARE: Discharge to home after PACU  PATIENT DISPOSITION:  PACU - hemodynamically stable.

## 2020-07-14 NOTE — Anesthesia Procedure Notes (Signed)
Procedure Name: LMA Insertion Date/Time: 07/14/2020 10:24 AM Performed by: Mechele Claude, CRNA Pre-anesthesia Checklist: Patient identified, Emergency Drugs available, Suction available and Patient being monitored Patient Re-evaluated:Patient Re-evaluated prior to induction Oxygen Delivery Method: Circle system utilized Preoxygenation: Pre-oxygenation with 100% oxygen Induction Type: IV induction Ventilation: Mask ventilation without difficulty LMA: LMA inserted LMA Size: 5.0 Number of attempts: 1 Airway Equipment and Method: Bite block Placement Confirmation: positive ETCO2 Tube secured with: Tape Dental Injury: Teeth and Oropharynx as per pre-operative assessment

## 2020-07-15 ENCOUNTER — Encounter (HOSPITAL_BASED_OUTPATIENT_CLINIC_OR_DEPARTMENT_OTHER): Payer: Self-pay | Admitting: Urology

## 2020-07-15 LAB — SURGICAL PATHOLOGY

## 2020-07-16 DIAGNOSIS — R310 Gross hematuria: Secondary | ICD-10-CM | POA: Diagnosis not present

## 2020-07-16 DIAGNOSIS — C678 Malignant neoplasm of overlapping sites of bladder: Secondary | ICD-10-CM | POA: Diagnosis not present

## 2020-07-17 DIAGNOSIS — R310 Gross hematuria: Secondary | ICD-10-CM | POA: Diagnosis not present

## 2020-07-17 DIAGNOSIS — C678 Malignant neoplasm of overlapping sites of bladder: Secondary | ICD-10-CM | POA: Diagnosis not present

## 2020-07-20 DIAGNOSIS — C678 Malignant neoplasm of overlapping sites of bladder: Secondary | ICD-10-CM | POA: Diagnosis not present

## 2020-08-19 DIAGNOSIS — Z5111 Encounter for antineoplastic chemotherapy: Secondary | ICD-10-CM | POA: Diagnosis not present

## 2020-08-19 DIAGNOSIS — C678 Malignant neoplasm of overlapping sites of bladder: Secondary | ICD-10-CM | POA: Diagnosis not present

## 2020-08-26 DIAGNOSIS — C678 Malignant neoplasm of overlapping sites of bladder: Secondary | ICD-10-CM | POA: Diagnosis not present

## 2020-08-26 DIAGNOSIS — Z5111 Encounter for antineoplastic chemotherapy: Secondary | ICD-10-CM | POA: Diagnosis not present

## 2020-09-02 DIAGNOSIS — C678 Malignant neoplasm of overlapping sites of bladder: Secondary | ICD-10-CM | POA: Diagnosis not present

## 2020-09-02 DIAGNOSIS — Z5111 Encounter for antineoplastic chemotherapy: Secondary | ICD-10-CM | POA: Diagnosis not present

## 2020-09-09 DIAGNOSIS — C678 Malignant neoplasm of overlapping sites of bladder: Secondary | ICD-10-CM | POA: Diagnosis not present

## 2020-09-09 DIAGNOSIS — Z5111 Encounter for antineoplastic chemotherapy: Secondary | ICD-10-CM | POA: Diagnosis not present

## 2020-09-16 DIAGNOSIS — Z5111 Encounter for antineoplastic chemotherapy: Secondary | ICD-10-CM | POA: Diagnosis not present

## 2020-09-16 DIAGNOSIS — C678 Malignant neoplasm of overlapping sites of bladder: Secondary | ICD-10-CM | POA: Diagnosis not present

## 2020-09-23 DIAGNOSIS — Z5111 Encounter for antineoplastic chemotherapy: Secondary | ICD-10-CM | POA: Diagnosis not present

## 2020-09-23 DIAGNOSIS — C678 Malignant neoplasm of overlapping sites of bladder: Secondary | ICD-10-CM | POA: Diagnosis not present

## 2020-10-12 DIAGNOSIS — C678 Malignant neoplasm of overlapping sites of bladder: Secondary | ICD-10-CM | POA: Diagnosis not present

## 2020-10-13 ENCOUNTER — Other Ambulatory Visit: Payer: Self-pay | Admitting: Urology

## 2020-10-13 NOTE — Progress Notes (Addendum)
COVID Vaccine Completed:  x3 Date COVID Vaccine completed: COVID vaccine manufacturer: Publishing rights manager  (Booster) &  [employer] x1  PCP - No PCP Cardiologist - N/A  Chest x-ray - N/A EKG - N/A Stress Test -  ECHO -  Cardiac Cath -  Pacemaker/ICD device last checked:  Sleep Study - N/A CPAP -   Fasting Blood Sugar - N/A Checks Blood Sugar _____ times a day  Blood Thinner Instructions: N/A Aspirin Instructions: Last Dose:  Anesthesia review:   Patient denies shortness of breath, fever, cough and chest pain at PAT appointment.  Patient can climb a flight of stairs and perform ADLs independently without difficulty.   Patient verbalized understanding of instructions that were given to them at the PAT appointment. Patient was also instructed that they will need to review over the PAT instructions again at home before surgery.

## 2020-10-14 ENCOUNTER — Other Ambulatory Visit: Payer: Self-pay

## 2020-10-14 ENCOUNTER — Encounter (HOSPITAL_COMMUNITY): Payer: Self-pay | Admitting: Urology

## 2020-10-19 ENCOUNTER — Other Ambulatory Visit (HOSPITAL_COMMUNITY)
Admission: RE | Admit: 2020-10-19 | Discharge: 2020-10-19 | Disposition: A | Discharge status: Home / Self Care | Payer: BC Managed Care – PPO | Source: Ambulatory Visit | Attending: Urology | Admitting: Urology

## 2020-10-19 DIAGNOSIS — Z20822 Contact with and (suspected) exposure to covid-19: Secondary | ICD-10-CM | POA: Insufficient documentation

## 2020-10-19 DIAGNOSIS — C678 Malignant neoplasm of overlapping sites of bladder: Secondary | ICD-10-CM | POA: Diagnosis not present

## 2020-10-19 DIAGNOSIS — Z01818 Encounter for other preprocedural examination: Secondary | ICD-10-CM | POA: Insufficient documentation

## 2020-10-19 DIAGNOSIS — N400 Enlarged prostate without lower urinary tract symptoms: Secondary | ICD-10-CM | POA: Diagnosis not present

## 2020-10-19 LAB — SARS CORONAVIRUS 2 (TAT 6-24 HRS): SARS Coronavirus 2: NEGATIVE

## 2020-10-19 NOTE — Anesthesia Preprocedure Evaluation (Addendum)
Anesthesia Evaluation  Patient identified by MRN, date of birth, ID band Patient awake    Reviewed: Allergy & Precautions, NPO status , Patient's Chart, lab work & pertinent test results  Airway Mallampati: III  TM Distance: >3 FB Neck ROM: Full    Dental no notable dental hx.    Pulmonary neg pulmonary ROS,    Pulmonary exam normal breath sounds clear to auscultation       Cardiovascular negative cardio ROS Normal cardiovascular exam Rhythm:Regular Rate:Normal     Neuro/Psych negative neurological ROS  negative psych ROS   GI/Hepatic negative GI ROS, Neg liver ROS,   Endo/Other  negative endocrine ROS  Renal/GU negative Renal ROS     Musculoskeletal negative musculoskeletal ROS (+)   Abdominal (+) + obese,   Peds  Hematology negative hematology ROS (+)   Anesthesia Other Findings BLADDER CANCER  Reproductive/Obstetrics                            Anesthesia Physical Anesthesia Plan  ASA: II  Anesthesia Plan: General   Post-op Pain Management:    Induction: Intravenous  PONV Risk Score and Plan: 2 and Ondansetron, Dexamethasone and Treatment may vary due to age or medical condition  Airway Management Planned: Oral ETT  Additional Equipment:   Intra-op Plan:   Post-operative Plan: Extubation in OR  Informed Consent: I have reviewed the patients History and Physical, chart, labs and discussed the procedure including the risks, benefits and alternatives for the proposed anesthesia with the patient or authorized representative who has indicated his/her understanding and acceptance.     Dental advisory given  Plan Discussed with: CRNA  Anesthesia Plan Comments:        Anesthesia Quick Evaluation

## 2020-10-20 ENCOUNTER — Ambulatory Visit (HOSPITAL_COMMUNITY): Payer: BC Managed Care – PPO | Admitting: Anesthesiology

## 2020-10-20 ENCOUNTER — Ambulatory Visit (HOSPITAL_COMMUNITY)
Admission: RE | Admit: 2020-10-20 | Discharge: 2020-10-20 | Disposition: A | Discharge status: Home / Self Care | Payer: BC Managed Care – PPO | Attending: Urology | Admitting: Urology

## 2020-10-20 ENCOUNTER — Encounter (HOSPITAL_COMMUNITY): Payer: Self-pay | Admitting: Urology

## 2020-10-20 ENCOUNTER — Encounter (HOSPITAL_COMMUNITY)
Admission: RE | Disposition: A | Discharge status: Home / Self Care | Payer: Self-pay | Source: Home / Self Care | Attending: Urology

## 2020-10-20 DIAGNOSIS — C678 Malignant neoplasm of overlapping sites of bladder: Secondary | ICD-10-CM | POA: Insufficient documentation

## 2020-10-20 DIAGNOSIS — C679 Malignant neoplasm of bladder, unspecified: Secondary | ICD-10-CM | POA: Diagnosis not present

## 2020-10-20 DIAGNOSIS — N400 Enlarged prostate without lower urinary tract symptoms: Secondary | ICD-10-CM | POA: Insufficient documentation

## 2020-10-20 DIAGNOSIS — Z20822 Contact with and (suspected) exposure to covid-19: Secondary | ICD-10-CM | POA: Diagnosis not present

## 2020-10-20 DIAGNOSIS — D090 Carcinoma in situ of bladder: Secondary | ICD-10-CM | POA: Diagnosis not present

## 2020-10-20 HISTORY — DX: Malignant neoplasm of bladder, unspecified: C67.9

## 2020-10-20 HISTORY — PX: TRANSURETHRAL RESECTION OF BLADDER TUMOR: SHX2575

## 2020-10-20 LAB — CBC
HCT: 47.6 % (ref 39.0–52.0)
Hemoglobin: 16.3 g/dL (ref 13.0–17.0)
MCH: 29.1 pg (ref 26.0–34.0)
MCHC: 34.2 g/dL (ref 30.0–36.0)
MCV: 84.8 fL (ref 80.0–100.0)
Platelets: 226 10*3/uL (ref 150–400)
RBC: 5.61 MIL/uL (ref 4.22–5.81)
RDW: 12.2 % (ref 11.5–15.5)
WBC: 6.5 10*3/uL (ref 4.0–10.5)
nRBC: 0 % (ref 0.0–0.2)

## 2020-10-20 LAB — BASIC METABOLIC PANEL
Anion gap: 12 (ref 5–15)
BUN: 16 mg/dL (ref 8–23)
CO2: 22 mmol/L (ref 22–32)
Calcium: 9 mg/dL (ref 8.9–10.3)
Chloride: 103 mmol/L (ref 98–111)
Creatinine, Ser: 0.88 mg/dL (ref 0.61–1.24)
GFR, Estimated: >60 mL/min (ref >60)
Glucose, Bld: 157 mg/dL — ABNORMAL HIGH (ref 70–99)
Potassium: 4.5 mmol/L (ref 3.5–5.1)
Sodium: 137 mmol/L (ref 135–145)

## 2020-10-20 SURGERY — TURBT (TRANSURETHRAL RESECTION OF BLADDER TUMOR)
Anesthesia: General

## 2020-10-20 MED ORDER — LIDOCAINE HCL (PF) 2 % IJ SOLN
INTRAMUSCULAR | Status: AC
  Filled 2020-10-20: qty 5

## 2020-10-20 MED ORDER — MIDAZOLAM HCL 5 MG/5ML IJ SOLN
INTRAMUSCULAR | Status: DC | PRN
  Administered 2020-10-20: 2 mg via INTRAVENOUS

## 2020-10-20 MED ORDER — KETOROLAC TROMETHAMINE 30 MG/ML IJ SOLN: 30.0000 mg | Freq: Once | INTRAMUSCULAR | Status: DC

## 2020-10-20 MED ORDER — ONDANSETRON HCL 4 MG/2ML IJ SOLN
INTRAMUSCULAR | Status: DC | PRN
  Administered 2020-10-20: 4 mg via INTRAVENOUS

## 2020-10-20 MED ORDER — OXYCODONE HCL 5 MG PO TABS
5.0000 mg | ORAL_TABLET | Freq: Once | ORAL | Status: DC | PRN
Start: 2020-10-20 — End: 2020-10-20

## 2020-10-20 MED ORDER — DEXAMETHASONE SODIUM PHOSPHATE 10 MG/ML IJ SOLN
INTRAMUSCULAR | Status: AC
  Filled 2020-10-20: qty 1

## 2020-10-20 MED ORDER — PHENAZOPYRIDINE HCL 200 MG PO TABS: 200.0000 mg | ORAL_TABLET | Freq: Three times a day (TID) | ORAL | 0 refills | Status: AC | PRN

## 2020-10-20 MED ORDER — FENTANYL CITRATE (PF) 250 MCG/5ML IJ SOLN
INTRAMUSCULAR | Status: AC
  Filled 2020-10-20: qty 5

## 2020-10-20 MED ORDER — PROPOFOL 10 MG/ML IV BOLUS
INTRAVENOUS | Status: AC
  Filled 2020-10-20: qty 20

## 2020-10-20 MED ORDER — LIDOCAINE HCL (PF) 2 % IJ SOLN
INTRAMUSCULAR | Status: DC | PRN
  Administered 2020-10-20: 75 mg via INTRADERMAL

## 2020-10-20 MED ORDER — FENTANYL CITRATE (PF) 100 MCG/2ML IJ SOLN: 25.0000 ug | INTRAMUSCULAR | Status: DC | PRN

## 2020-10-20 MED ORDER — MIDAZOLAM HCL 2 MG/2ML IJ SOLN
INTRAMUSCULAR | Status: AC
  Filled 2020-10-20: qty 2

## 2020-10-20 MED ORDER — ROCURONIUM BROMIDE 100 MG/10ML IV SOLN
INTRAVENOUS | Status: DC | PRN
  Administered 2020-10-20: 10 mg via INTRAVENOUS
  Administered 2020-10-20: 40 mg via INTRAVENOUS
  Administered 2020-10-20: 10 mg via INTRAVENOUS

## 2020-10-20 MED ORDER — DEXAMETHASONE SODIUM PHOSPHATE 10 MG/ML IJ SOLN
INTRAMUSCULAR | Status: DC | PRN
  Administered 2020-10-20: 10 mg via INTRAVENOUS

## 2020-10-20 MED ORDER — LACTATED RINGERS IV SOLN: INTRAVENOUS | Status: DC

## 2020-10-20 MED ORDER — SODIUM CHLORIDE 0.9 % IR SOLN
Status: DC | PRN
  Administered 2020-10-20: 3000 mL

## 2020-10-20 MED ORDER — PROMETHAZINE HCL 25 MG/ML IJ SOLN: 6.2500 mg | INTRAMUSCULAR | Status: DC | PRN

## 2020-10-20 MED ORDER — ORAL CARE MOUTH RINSE: 15.0000 mL | Freq: Once | OROMUCOSAL | Status: AC

## 2020-10-20 MED ORDER — SUGAMMADEX SODIUM 500 MG/5ML IV SOLN
INTRAVENOUS | Status: DC | PRN
  Administered 2020-10-20: 300 mg via INTRAVENOUS

## 2020-10-20 MED ORDER — PHENYLEPHRINE HCL (PRESSORS) 10 MG/ML IV SOLN
INTRAVENOUS | Status: AC
  Filled 2020-10-20: qty 1

## 2020-10-20 MED ORDER — ACETAMINOPHEN 10 MG/ML IV SOLN: 1000.0000 mg | Freq: Once | INTRAVENOUS | Status: DC | PRN

## 2020-10-20 MED ORDER — OXYCODONE HCL 5 MG/5ML PO SOLN: 5.0000 mg | Freq: Once | ORAL | Status: DC | PRN

## 2020-10-20 MED ORDER — CIPROFLOXACIN IN D5W 400 MG/200ML IV SOLN
400.0000 mg | INTRAVENOUS | Status: AC
  Administered 2020-10-20: 400 mg via INTRAVENOUS
  Filled 2020-10-20: qty 200

## 2020-10-20 MED ORDER — CHLORHEXIDINE GLUCONATE 0.12 % MT SOLN
15.0000 mL | Freq: Once | OROMUCOSAL | Status: AC
  Administered 2020-10-20: 15 mL via OROMUCOSAL

## 2020-10-20 MED ORDER — LABETALOL HCL 5 MG/ML IV SOLN
INTRAVENOUS | Status: DC | PRN
  Administered 2020-10-20: 2.5 mg via INTRAVENOUS
  Administered 2020-10-20: 5 mg via INTRAVENOUS

## 2020-10-20 MED ORDER — PROPOFOL 10 MG/ML IV BOLUS
INTRAVENOUS | Status: DC | PRN
  Administered 2020-10-20: 180 mg via INTRAVENOUS
  Administered 2020-10-20: 20 mg via INTRAVENOUS

## 2020-10-20 MED ORDER — FENTANYL CITRATE (PF) 100 MCG/2ML IJ SOLN
INTRAMUSCULAR | Status: DC | PRN
  Administered 2020-10-20: 50 ug via INTRAVENOUS
  Administered 2020-10-20: 100 ug via INTRAVENOUS
  Administered 2020-10-20 (×2): 50 ug via INTRAVENOUS
  Administered 2020-10-20: 100 ug via INTRAVENOUS

## 2020-10-20 MED ORDER — OXYCODONE-ACETAMINOPHEN 5-325 MG PO TABS: 1.0000 | ORAL_TABLET | ORAL | 0 refills | Status: DC | PRN

## 2020-10-20 MED ORDER — ONDANSETRON HCL 4 MG/2ML IJ SOLN
INTRAMUSCULAR | Status: AC
  Filled 2020-10-20: qty 2

## 2020-10-20 SURGICAL SUPPLY — 15 items
BAG URINE DRAIN 2000ML AR STRL (UROLOGICAL SUPPLIES) IMPLANT
BAG URO CATCHER STRL LF (MISCELLANEOUS) ×2 IMPLANT
CLOTH BEACON ORANGE TIMEOUT ST (SAFETY) ×2 IMPLANT
ELECT REM PT RETURN 15FT ADLT (MISCELLANEOUS) ×2 IMPLANT
GLOVE SURG ENC MOIS LTX SZ6.5 (GLOVE) ×2 IMPLANT
GOWN STRL REUS W/TWL LRG LVL3 (GOWN DISPOSABLE) ×2 IMPLANT
IV NS IRRIG 3000ML ARTHROMATIC (IV SOLUTION) ×6 IMPLANT
KIT TURNOVER KIT A (KITS) IMPLANT
LOOP CUT BIPOLAR 24F LRG (ELECTROSURGICAL) ×2 IMPLANT
MANIFOLD NEPTUNE II (INSTRUMENTS) ×2 IMPLANT
PACK CYSTO (CUSTOM PROCEDURE TRAY) ×2 IMPLANT
SYR TOOMEY IRRIG 70ML (MISCELLANEOUS) ×2
SYRINGE TOOMEY IRRIG 70ML (MISCELLANEOUS) ×1 IMPLANT
TUBING CONNECTING 10 (TUBING) ×2 IMPLANT
TUBING UROLOGY SET (TUBING) ×2 IMPLANT

## 2020-10-20 NOTE — Anesthesia Postprocedure Evaluation (Signed)
Anesthesia Post Note  Patient: Kevin Buck  Procedure(s) Performed: TRANSURETHRAL RESECTION OF BLADDER TUMOR (TURBT) (N/A )     Patient location during evaluation: PACU Anesthesia Type: General Level of consciousness: awake Pain management: pain level controlled Vital Signs Assessment: post-procedure vital signs reviewed and stable Respiratory status: spontaneous breathing, nonlabored ventilation, respiratory function stable and patient connected to nasal cannula oxygen Cardiovascular status: blood pressure returned to baseline and stable Postop Assessment: no apparent nausea or vomiting Anesthetic complications: no   No complications documented.  Last Vitals:  Vitals:   10/20/20 1300 10/20/20 1359  BP: (!) 148/95 (!) 167/106  Pulse: 85 99  Resp: 18 18  Temp:  (!) 36.3 C  SpO2: 97% 97%    Last Pain:  Vitals:   10/20/20 1359  TempSrc: Oral  PainSc: 1                  Tia Gelb P Dodd Schmid

## 2020-10-20 NOTE — Progress Notes (Signed)
Pt voided but little amount. Dr. Arita Miss made aware and instructed to keep pt another hour for voiding trial .

## 2020-10-20 NOTE — Op Note (Addendum)
PATIENT:  Kevin Buck  PRE-OPERATIVE DIAGNOSIS: Bladder cancer recurrence  POST-OPERATIVE DIAGNOSIS: Bladder cancer recurrent  PROCEDURE:  Procedure(s): 1.  Multifocal TRANSURETHRAL RESECTION OF BLADDER TUMOR (TURBT) (3cm.)   SURGEON: Kasandra Knudsen, MD  ANESTHESIA:   General  EBL:  Minimal  DRAINS: Urethral catheter (22 Fr. Foley)   SPECIMEN:  Bladder tumor  DISPOSITION OF SPECIMEN:  PATHOLOGY  Findings: 1. Normal urethra 2. Bilobar prostatic hypertrophy 3. Left ureteral orifice with prior resection seen laterally to normal position; right ureteral orifice in orthotopic position; orifices seen at start and end of case 4.  Prior well-healed scar seen posterior left bladder wall 5.  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  Description of operation: The patient was taken to the operating room and administered general anesthesia. They were then placed on the table and moved to the dorsal lithotomy position after which the genitalia was sterilely prepped and draped. An official timeout was then performed.  The 43 French resectoscope with the 30 lens and visual obturator were then passed into the bladder under direct visualization. Urethra appeared normal. The visual obturator was then removed and the Gyrus resectoscope element with 30  lens was then inserted and the bladder was fully and systematically inspected. Ureteral orifices were noted to be in the normal anatomic positions.   I first began by resecting bladder tumor seen on posterior wall.  Next bladder tumors bladder neck were resected.  Of note these were seen growing into  prostatic urethra.  Fulguration of any erythematous area also took place.  There appeared to be early bladder cancer regrowth on much of the urothelium.  Much of this could be pushed off of the loop and fulgurated.  Reinspection of the bladder revealed all obvious tumor had been fully resected and there was no evidence of perforation. The [place] syringe was then used to irrigate the bladder and remove all of the portions of bladder tumor which were sent to pathology. I then removed the resectoscope.  A 20 French Foley catheter was then inserted in the bladder and irrigated. The irrigant returned slightly pink-tinged but clear. The patient was awakened and taken to the recovery room.   PLAN OF CARE: Discharge to home after PACU  PATIENT DISPOSITION:  PACU - hemodynamically stable.

## 2020-10-20 NOTE — H&P (Signed)
CC/HPI: cc: bladder cancer   07/21/20: 65 year old man with high-grade T1 bladder cancer diagnosed in August 2021 with recent restaging TURBT that showed high-grade noninvasive bladder cancer.   10/12/20: Patient has completed induction BCG and is here for 1st surveillance cystoscopy. Patient denies any gross hematuria.     ALLERGIES: NKDA    MEDICATIONS: Oxybutynin Chloride 5 mg tablet 1 tablet PO Q 8 H PRN bladder spasms while foley in place  Tamsulosin Hcl 0.4 mg capsule 1 capsule PO Q HS  Multivitamin  Prostate Plus [facility]     GU PSH: Bladder Instill AntiCA Agent - 09/23/2020, 09/16/2020, 09/09/2020, 09/02/2020, 08/26/2020, 08/19/2020 Cystoscopy TURBT 2-5 cm - 07/14/2020 Locm 300-399Mg /Ml Iodine,1Ml - 06/11/2020     NON-GU PSH: Tonsillectomy - 1965     GU PMH: Bladder Cancer overlapping sites - 09/23/2020, - 09/16/2020, - 09/09/2020, - 09/02/2020, - 08/26/2020, - 08/19/2020, Patient has now undergone TURBT and restaging TURBT which confirms high-grade T1 bladder cancer. We have discussed management options and will proceed with induction BCG weekly for 6 weeks. He will then have a surveillance cystoscopy done 3 months from restaging TURBT. 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 Muscle was present but not involved. Due to pathology as well as overall tumor burden patient will be scheduled for restaging TURBT and about 3 weeks. We have already discussed the need for postoperative induction BCG if repeat pathology confirms high-grade T1. Risks and benefits of TURBT including but not limited to bleeding, pain, bladder perforation, damage to surrounding structures including urethra/prostate/ureteral orifice sees, need for Foley catheter, need for future treatment, pain all discussed with patient. Surgery scheduled for 07/14/2020., - 06/19/2020 Gross  hematuria - 07/17/2020, - 07/16/2020, 22 French Foley catheter placed and irrigation attempted. No clots were irrigated however urine is moderately red still. Patient did not tolerate clot irrigation well. Based on CT scan there is large organized clot versus tumor. Discussed the risks and benefits of a cystoscopy with clot evacuation of possible TURBT. We discussed doing this today verses next week. Patient would like to proceed immediately. Risks and benefits of this discussed including but element she bleeding, infection, damage to surrounding structures, need for Foley catheter, need for future treatment, pain. Patient has agreed to proceed will be scheduled for surgery later today., - 06/11/2020 BPH w/LUTS, Will also try tamsulosin 0.4 mg daily to see if this helps his symptoms. - 06/19/2020 Nocturia - 06/19/2020 Urinary Frequency - 06/19/2020    NON-GU PMH: None   FAMILY HISTORY: Prostate Cancer - Father   SOCIAL HISTORY: Marital Status: Single Current Smoking Status: Patient has never smoked.   Tobacco Use Assessment Completed: Used Tobacco in last 30 days? Drinks 8 drinks per year. Types of alcohol consumed: Wine.     REVIEW OF SYSTEMS:    GU Review Male:   Patient denies frequent urination, hard to postpone urination, burning/ pain with urination, get up at night to urinate, leakage of urine, stream starts and stops, trouble starting your stream, have to strain to urinate , erection problems, and penile pain.  Gastrointestinal (Upper):   Patient denies nausea, vomiting, and indigestion/ heartburn.  Gastrointestinal (Lower):   Patient denies diarrhea and constipation.  Constitutional:   Patient denies fever, night sweats, weight loss, and fatigue.  Skin:   Patient denies skin rash/ lesion and itching.  Eyes:   Patient denies blurred vision and  double vision.  Ears/ Nose/ Throat:   Patient denies sore throat and sinus problems.  Hematologic/Lymphatic:   Patient denies swollen glands and easy  bruising.  Cardiovascular:   Patient denies leg swelling and chest pains.  Respiratory:   Patient denies cough and shortness of breath.  Endocrine:   Patient denies excessive thirst.  Musculoskeletal:   Patient denies back pain and joint pain.  Neurological:   Patient denies headaches and dizziness.  Psychologic:   Patient denies depression and anxiety.   VITAL SIGNS: None   MULTI-SYSTEM PHYSICAL EXAMINATION:    Constitutional: Well-nourished. No physical deformities. Normally developed. Good grooming.  Neck: Neck symmetrical, not swollen. Normal tracheal position.  Respiratory: No labored breathing, no use of accessory muscles.   Skin: No paleness, no jaundice, no cyanosis. No lesion, no ulcer, no rash.  Neurologic / Psychiatric: Oriented to time, oriented to place, oriented to person. No depression, no anxiety, no agitation.  Gastrointestinal: No rigidity  Eyes: Normal conjunctivae. Normal eyelids.  Ears, Nose, Mouth, and Throat: Left ear no scars, no lesions, no masses. Right ear no scars, no lesions, no masses. Nose no scars, no lesions, no masses. Normal hearing. Normal lips.  Musculoskeletal: Normal gait and station of head and neck.     Complexity of Data:  Records Review:   Previous Patient Records  Urine Test Review:   Urinalysis   PROCEDURES:         Flexible Cystoscopy - 52000  Risks, benefits, and some of the potential complications of the procedure were discussed at length with the patient including infection, bleeding, voiding discomfort, urinary retention, fever, chills, sepsis, and others. All questions were answered. Informed consent was obtained. Sterile technique and intraurethral analgesia were used.  Meatus:  Normal size. Normal location. Normal condition.  Urethra:  No strictures.  External Sphincter:  Normal.  Verumontanum:  Normal.  Prostate:  Obstructing lateral lobes  Bladder Neck:  Non-obstructing.  Ureteral Orifices:  Normal location. Normal size. Normal  shape. Effluxed clear urine.  Bladder:  Multiple papillary bladder masses seen scattered throughout posterior and anterior bladder wall consistent with bladder cancer recurrence.      The lower urinary tract was carefully examined. The procedure was well-tolerated and without complications. Instructions were given to call the office immediately for bloody urine, difficulty urinating, urinary retention, painful or frequent urination, fever, chills, nausea, vomiting or other illness. The patient stated that he understood these instructions and would comply with them.         Urinalysis w/Scope - 81001 Dipstick Dipstick Cont'd Micro  Color: Yellow Bilirubin: Neg WBC/hpf: 6 - 10/hpf  Appearance: Slightly Cloudy Ketones: Neg RBC/hpf: 20 - 40/hpf  Specific Gravity: 1.025 Blood: 3+ Bacteria: Few (10-25/hpf)  pH: 5.5 Protein: Trace Cystals: NS (Not Seen)  Glucose: Trace Urobilinogen: 0.2 Casts: NS (Not Seen)    Nitrites: Neg Trichomonas: Not Present    Leukocyte Esterase: 1+ Mucous: Not Present      Epithelial Cells: 0 - 5/hpf      Yeast: NS (Not Seen)      Sperm: Not Present    Notes:      ASSESSMENT:      ICD-10 Details  1 GU:   Bladder Cancer overlapping sites - C67.8 Chronic, Worsening   PLAN:           Document Letter(s):  Created for Patient: Clinical Summary         Notes:   Cystoscopy today reveals bladder cancer recurrence following induction BCG.  I discussed with him repeating TURBT.  Risks and benefits of the procedure were discussed with the patient including but not limited to bleeding, infection, damage to adjacent organs, bladder perforation, need for Foley catheter, need for future treatment, pain. Based on repeat pathology will discuss further bladder management. Next available TURBT to be scheduled.

## 2020-10-20 NOTE — Anesthesia Procedure Notes (Signed)
Procedure Name: Intubation Date/Time: 10/20/2020 8:33 AM Performed by: Lissa Morales, CRNA Pre-anesthesia Checklist: Patient identified, Emergency Drugs available, Suction available and Patient being monitored Patient Re-evaluated:Patient Re-evaluated prior to induction Oxygen Delivery Method: Circle system utilized Preoxygenation: Pre-oxygenation with 100% oxygen Induction Type: IV induction Ventilation: Mask ventilation without difficulty Laryngoscope Size: Mac and 4 Grade View: Grade II Tube type: Oral Tube size: 8.0 mm Number of attempts: 1 Airway Equipment and Method: Stylet and Oral airway Placement Confirmation: ETT inserted through vocal cords under direct vision,  positive ETCO2 and breath sounds checked- equal and bilateral Secured at: 21 cm Tube secured with: Tape Dental Injury: Teeth and Oropharynx as per pre-operative assessment  Comments: Anterior larynx, easy mask ventilation

## 2020-10-20 NOTE — Interval H&P Note (Signed)
History and Physical Interval Note:  10/20/2020 7:53 AM  Kevin Buck  has presented today for surgery, with the diagnosis of BLADDER CANCER.  The various methods of treatment have been discussed with the patient and family. After consideration of risks, benefits and other options for treatment, the patient has consented to  Procedure(s): TRANSURETHRAL RESECTION OF BLADDER TUMOR (TURBT) (N/A) as a surgical intervention.  The patient's history has been reviewed, patient examined, no change in status, stable for surgery.  I have reviewed the patient's chart and labs.  Questions were answered to the patient's satisfaction.     Jamesrobert Ohanesian D Linsey Hirota

## 2020-10-20 NOTE — Discharge Instructions (Signed)

## 2020-10-20 NOTE — Transfer of Care (Addendum)
Immediate Anesthesia Transfer of Care Note  Patient: Kevin Buck  Procedure(s) Performed: TRANSURETHRAL RESECTION OF BLADDER TUMOR (TURBT) (N/A )  Patient Location: PACU  Anesthesia Type:General  Level of Consciousness: awake, alert , oriented and patient cooperative  Airway & Oxygen Therapy: Patient Spontanous Breathing and Patient connected to face mask oxygen  Post-op Assessment: Report given to RN, Post -op Vital signs reviewed and stable and Patient moving all extremities X 4  Post vital signs: stable  Last Vitals:  Vitals Value Taken Time  BP 145/101 10/20/20 0948  Temp 37 C 10/20/20 0948  Pulse 79 10/20/20 0955  Resp 13 10/20/20 0955  SpO2 97 % 10/20/20 0955  Vitals shown include unvalidated device data.  Last Pain:  Vitals:   10/20/20 0948  PainSc: 0-No pain         Complications: No complications documented.

## 2020-10-21 ENCOUNTER — Encounter (HOSPITAL_COMMUNITY): Payer: Self-pay | Admitting: Urology

## 2020-10-21 LAB — SURGICAL PATHOLOGY

## 2020-10-27 DIAGNOSIS — C678 Malignant neoplasm of overlapping sites of bladder: Secondary | ICD-10-CM | POA: Diagnosis not present

## 2020-10-27 DIAGNOSIS — R8271 Bacteriuria: Secondary | ICD-10-CM | POA: Diagnosis not present

## 2020-11-20 DIAGNOSIS — C678 Malignant neoplasm of overlapping sites of bladder: Secondary | ICD-10-CM | POA: Diagnosis not present

## 2020-11-20 DIAGNOSIS — Z5111 Encounter for antineoplastic chemotherapy: Secondary | ICD-10-CM | POA: Diagnosis not present

## 2020-11-27 DIAGNOSIS — N390 Urinary tract infection, site not specified: Secondary | ICD-10-CM | POA: Diagnosis not present

## 2020-11-27 DIAGNOSIS — C678 Malignant neoplasm of overlapping sites of bladder: Secondary | ICD-10-CM | POA: Diagnosis not present

## 2020-11-27 DIAGNOSIS — B957 Other staphylococcus as the cause of diseases classified elsewhere: Secondary | ICD-10-CM | POA: Diagnosis not present

## 2020-12-11 DIAGNOSIS — Z5111 Encounter for antineoplastic chemotherapy: Secondary | ICD-10-CM | POA: Diagnosis not present

## 2020-12-11 DIAGNOSIS — C678 Malignant neoplasm of overlapping sites of bladder: Secondary | ICD-10-CM | POA: Diagnosis not present

## 2020-12-18 DIAGNOSIS — C678 Malignant neoplasm of overlapping sites of bladder: Secondary | ICD-10-CM | POA: Diagnosis not present

## 2020-12-18 DIAGNOSIS — Z5111 Encounter for antineoplastic chemotherapy: Secondary | ICD-10-CM | POA: Diagnosis not present

## 2020-12-25 DIAGNOSIS — Z5111 Encounter for antineoplastic chemotherapy: Secondary | ICD-10-CM | POA: Diagnosis not present

## 2020-12-25 DIAGNOSIS — C678 Malignant neoplasm of overlapping sites of bladder: Secondary | ICD-10-CM | POA: Diagnosis not present

## 2021-01-01 DIAGNOSIS — C678 Malignant neoplasm of overlapping sites of bladder: Secondary | ICD-10-CM | POA: Diagnosis not present

## 2021-01-01 DIAGNOSIS — Z5111 Encounter for antineoplastic chemotherapy: Secondary | ICD-10-CM | POA: Diagnosis not present

## 2021-01-08 DIAGNOSIS — Z5111 Encounter for antineoplastic chemotherapy: Secondary | ICD-10-CM | POA: Diagnosis not present

## 2021-01-08 DIAGNOSIS — C678 Malignant neoplasm of overlapping sites of bladder: Secondary | ICD-10-CM | POA: Diagnosis not present

## 2021-02-11 DIAGNOSIS — C678 Malignant neoplasm of overlapping sites of bladder: Secondary | ICD-10-CM | POA: Diagnosis not present

## 2021-04-09 DIAGNOSIS — Z5111 Encounter for antineoplastic chemotherapy: Secondary | ICD-10-CM | POA: Diagnosis not present

## 2021-04-09 DIAGNOSIS — C678 Malignant neoplasm of overlapping sites of bladder: Secondary | ICD-10-CM | POA: Diagnosis not present

## 2021-04-16 DIAGNOSIS — Z5111 Encounter for antineoplastic chemotherapy: Secondary | ICD-10-CM | POA: Diagnosis not present

## 2021-04-16 DIAGNOSIS — C678 Malignant neoplasm of overlapping sites of bladder: Secondary | ICD-10-CM | POA: Diagnosis not present

## 2021-04-23 DIAGNOSIS — C678 Malignant neoplasm of overlapping sites of bladder: Secondary | ICD-10-CM | POA: Diagnosis not present

## 2021-04-29 DIAGNOSIS — R8271 Bacteriuria: Secondary | ICD-10-CM | POA: Diagnosis not present

## 2021-05-14 DIAGNOSIS — C678 Malignant neoplasm of overlapping sites of bladder: Secondary | ICD-10-CM | POA: Diagnosis not present

## 2021-05-18 ENCOUNTER — Other Ambulatory Visit: Payer: Self-pay | Admitting: Urology

## 2021-05-18 DIAGNOSIS — Z8616 Personal history of COVID-19: Secondary | ICD-10-CM

## 2021-05-18 HISTORY — DX: Personal history of COVID-19: Z86.16

## 2021-05-21 LAB — SARS CORONAVIRUS 2 (TAT 6-24 HRS): SARS Coronavirus 2: POSITIVE — AB

## 2021-05-21 NOTE — Progress Notes (Addendum)
Rosann Auerbach from [facility] called [facility] Short stay to report a covid test result.  Rosann Auerbach stated that patient's test resulted over night and results were sent to Dr. Jacalyn Lefevre.  After Dr. Claudia Desanctis received the results, she called [facility] after hours and left a message that she did not authorize the covid test.    Rosann Auerbach called short stay to ask for an updated physician so that covid test order could be placed under the correct physician. When looking at the chart, there is a consent order for a cystoscopy, bladder biopsy, and fulgeration under Dr. Jacalyn Lefevre but orders are held and have not been released.  There is no scheduled procedure and no order for a covid test.  Orders were placed by Se'Lita Brasher.  Multiple attempts have been made to reach [place] at [facility] with no success.    Short stay nurse also reached out to patient to inform patient of covid test result but patient did not answer phone and mailbox was full.  A second attempt was made but again, no success.  Rosann Auerbach from [facility] stated that would continue to contact the patient.      Update at 1340:  Connie from [facility] returned call and confirmed that patient is currently not scheduled for surgery and she is unsure as to why the patient was sent to [facility] for covid testing.  Marlowe Kays gave instructions for the order to be placed under the physician on call, Dr. Nicolette Bang. Marlowe Kays stated that she would also reach out to the patient.

## 2021-06-04 ENCOUNTER — Encounter (HOSPITAL_BASED_OUTPATIENT_CLINIC_OR_DEPARTMENT_OTHER): Payer: Self-pay | Admitting: Urology

## 2021-06-04 ENCOUNTER — Other Ambulatory Visit: Payer: Self-pay

## 2021-06-04 NOTE — Progress Notes (Signed)
Spoke w/ via phone for pre-op interview--- pt Lab needs dos----  no             Lab results------ no COVID test -----patient states asymptomatic no test needed Arrive at ------- 0530 on 06-08-2021 NPO after MN NO Solid Food.  Clear liquids from MN until--- 0430 Med rec completed Medications to take morning of surgery ----- none Diabetic medication ----- n/a Patient instructed no nail polish to be worn day of surgery Patient instructed to bring photo id and insurance card day of surgery Patient aware to have Driver (ride ) / caregiver for 24 hours after surgery --sig other,  judy byers Patient Special Instructions ----- n/a Pre-Op special Istructions ----- n/a Patient verbalized understanding of instructions that were given at this phone interview. Patient denies shortness of breath, chest pain, fever, cough at this phone interview.

## 2021-06-08 ENCOUNTER — Ambulatory Visit (HOSPITAL_BASED_OUTPATIENT_CLINIC_OR_DEPARTMENT_OTHER): Payer: BC Managed Care – PPO | Admitting: Anesthesiology

## 2021-06-08 ENCOUNTER — Encounter (HOSPITAL_BASED_OUTPATIENT_CLINIC_OR_DEPARTMENT_OTHER): Payer: Self-pay | Admitting: Urology

## 2021-06-08 ENCOUNTER — Encounter (HOSPITAL_BASED_OUTPATIENT_CLINIC_OR_DEPARTMENT_OTHER)
Admission: RE | Disposition: A | Discharge status: Home / Self Care | Payer: Self-pay | Source: Home / Self Care | Attending: Urology

## 2021-06-08 ENCOUNTER — Ambulatory Visit (HOSPITAL_BASED_OUTPATIENT_CLINIC_OR_DEPARTMENT_OTHER)
Admission: RE | Admit: 2021-06-08 | Discharge: 2021-06-08 | Disposition: A | Discharge status: Home / Self Care | Payer: BC Managed Care – PPO | Attending: Urology | Admitting: Urology

## 2021-06-08 DIAGNOSIS — N3289 Other specified disorders of bladder: Secondary | ICD-10-CM | POA: Diagnosis not present

## 2021-06-08 DIAGNOSIS — C679 Malignant neoplasm of bladder, unspecified: Secondary | ICD-10-CM | POA: Diagnosis not present

## 2021-06-08 DIAGNOSIS — C678 Malignant neoplasm of overlapping sites of bladder: Secondary | ICD-10-CM | POA: Diagnosis not present

## 2021-06-08 DIAGNOSIS — I781 Nevus, non-neoplastic: Secondary | ICD-10-CM | POA: Diagnosis not present

## 2021-06-08 DIAGNOSIS — D494 Neoplasm of unspecified behavior of bladder: Secondary | ICD-10-CM | POA: Diagnosis not present

## 2021-06-08 HISTORY — PX: CYSTOSCOPY WITH BIOPSY: SHX5122

## 2021-06-08 SURGERY — CYSTOSCOPY, WITH BIOPSY
Anesthesia: General | Site: Bladder

## 2021-06-08 MED ORDER — OXYCODONE HCL 5 MG/5ML PO SOLN: 5.0000 mg | Freq: Once | ORAL | Status: DC | PRN

## 2021-06-08 MED ORDER — CEFAZOLIN SODIUM-DEXTROSE 2-4 GM/100ML-% IV SOLN
INTRAVENOUS | Status: AC
  Filled 2021-06-08: qty 100

## 2021-06-08 MED ORDER — DEXAMETHASONE SODIUM PHOSPHATE 10 MG/ML IJ SOLN
INTRAMUSCULAR | Status: AC
  Filled 2021-06-08: qty 1

## 2021-06-08 MED ORDER — PROPOFOL 10 MG/ML IV BOLUS
INTRAVENOUS | Status: DC | PRN
  Administered 2021-06-08: 50 mg via INTRAVENOUS
  Administered 2021-06-08: 150 mg via INTRAVENOUS

## 2021-06-08 MED ORDER — MIDAZOLAM HCL 5 MG/5ML IJ SOLN
INTRAMUSCULAR | Status: DC | PRN
  Administered 2021-06-08: 2 mg via INTRAVENOUS

## 2021-06-08 MED ORDER — CEFAZOLIN SODIUM-DEXTROSE 2-4 GM/100ML-% IV SOLN
2.0000 g | INTRAVENOUS | Status: AC
  Administered 2021-06-08: 2 g via INTRAVENOUS

## 2021-06-08 MED ORDER — MEPERIDINE HCL 25 MG/ML IJ SOLN: 6.2500 mg | INTRAMUSCULAR | Status: DC | PRN

## 2021-06-08 MED ORDER — FENTANYL CITRATE (PF) 100 MCG/2ML IJ SOLN
INTRAMUSCULAR | Status: DC | PRN
  Administered 2021-06-08: 50 ug via INTRAVENOUS
  Administered 2021-06-08 (×2): 25 ug via INTRAVENOUS

## 2021-06-08 MED ORDER — SODIUM CHLORIDE 0.9 % IR SOLN
Status: DC | PRN
  Administered 2021-06-08: 3000 mL

## 2021-06-08 MED ORDER — FENTANYL CITRATE (PF) 100 MCG/2ML IJ SOLN: 25.0000 ug | INTRAMUSCULAR | Status: DC | PRN

## 2021-06-08 MED ORDER — GEMCITABINE CHEMO FOR BLADDER INSTILLATION 2000 MG
2000.0000 mg | Freq: Once | INTRAVENOUS | Status: AC
  Administered 2021-06-08: 2000 mg via INTRAVESICAL
  Filled 2021-06-08: qty 2000

## 2021-06-08 MED ORDER — HYDROCODONE-ACETAMINOPHEN 5-325 MG PO TABS: 1.0000 | ORAL_TABLET | ORAL | 0 refills | Status: AC | PRN

## 2021-06-08 MED ORDER — KETOROLAC TROMETHAMINE 30 MG/ML IJ SOLN
INTRAMUSCULAR | Status: DC | PRN
  Administered 2021-06-08: 30 mg via INTRAVENOUS

## 2021-06-08 MED ORDER — IBUPROFEN 100 MG/5ML PO SUSP
200.0000 mg | Freq: Four times a day (QID) | ORAL | Status: DC | PRN
  Filled 2021-06-08: qty 20

## 2021-06-08 MED ORDER — OXYCODONE HCL 5 MG PO TABS
5.0000 mg | ORAL_TABLET | Freq: Once | ORAL | Status: DC | PRN
Start: 2021-06-08 — End: 2021-06-08

## 2021-06-08 MED ORDER — LIDOCAINE HCL (CARDIAC) PF 100 MG/5ML IV SOSY
PREFILLED_SYRINGE | INTRAVENOUS | Status: DC | PRN
  Administered 2021-06-08: 100 mg via INTRAVENOUS

## 2021-06-08 MED ORDER — IBUPROFEN 200 MG PO TABS: 200.0000 mg | ORAL_TABLET | Freq: Four times a day (QID) | ORAL | Status: DC | PRN

## 2021-06-08 MED ORDER — ONDANSETRON HCL 4 MG/2ML IJ SOLN
INTRAMUSCULAR | Status: AC
  Filled 2021-06-08: qty 2

## 2021-06-08 MED ORDER — MIDAZOLAM HCL 2 MG/2ML IJ SOLN
INTRAMUSCULAR | Status: AC
  Filled 2021-06-08: qty 2

## 2021-06-08 MED ORDER — ACETAMINOPHEN 10 MG/ML IV SOLN: 1000.0000 mg | Freq: Once | INTRAVENOUS | Status: DC | PRN

## 2021-06-08 MED ORDER — ONDANSETRON HCL 4 MG/2ML IJ SOLN
INTRAMUSCULAR | Status: DC | PRN
  Administered 2021-06-08: 4 mg via INTRAVENOUS

## 2021-06-08 MED ORDER — LACTATED RINGERS IV SOLN: INTRAVENOUS | Status: DC

## 2021-06-08 MED ORDER — STERILE WATER FOR IRRIGATION IR SOLN
Status: DC | PRN
  Administered 2021-06-08: 500 mL

## 2021-06-08 MED ORDER — PROPOFOL 10 MG/ML IV BOLUS
INTRAVENOUS | Status: AC
  Filled 2021-06-08: qty 40

## 2021-06-08 MED ORDER — LIDOCAINE HCL (PF) 2 % IJ SOLN
INTRAMUSCULAR | Status: AC
  Filled 2021-06-08: qty 5

## 2021-06-08 MED ORDER — FENTANYL CITRATE (PF) 100 MCG/2ML IJ SOLN
INTRAMUSCULAR | Status: AC
  Filled 2021-06-08: qty 2

## 2021-06-08 MED ORDER — ONDANSETRON HCL 4 MG/2ML IJ SOLN: 4.0000 mg | Freq: Once | INTRAMUSCULAR | Status: DC | PRN

## 2021-06-08 MED ORDER — DEXAMETHASONE SODIUM PHOSPHATE 4 MG/ML IJ SOLN
INTRAMUSCULAR | Status: DC | PRN
  Administered 2021-06-08: 10 mg via INTRAVENOUS

## 2021-06-08 SURGICAL SUPPLY — 21 items
BAG DRAIN URO-CYSTO SKYTR STRL (DRAIN) ×2 IMPLANT
BAG DRN RND TRDRP ANRFLXCHMBR (UROLOGICAL SUPPLIES) ×1
BAG DRN UROCATH (DRAIN) ×1
BAG URINE DRAIN 2000ML AR STRL (UROLOGICAL SUPPLIES) ×2 IMPLANT
CATH COUDE FOLEY 2W 5CC 18FR (CATHETERS) ×2 IMPLANT
CLOTH BEACON ORANGE TIMEOUT ST (SAFETY) ×2 IMPLANT
DRSG TELFA 3X8 NADH (GAUZE/BANDAGES/DRESSINGS) ×2 IMPLANT
ELECT REM PT RETURN 9FT ADLT (ELECTROSURGICAL) ×2
ELECTRODE REM PT RTRN 9FT ADLT (ELECTROSURGICAL) ×1 IMPLANT
GLOVE SURG ENC MOIS LTX SZ6.5 (GLOVE) ×2 IMPLANT
GOWN STRL REUS W/TWL LRG LVL3 (GOWN DISPOSABLE) ×2 IMPLANT
HOLDER FOLEY CATH W/STRAP (MISCELLANEOUS) ×2 IMPLANT
IV NS IRRIG 3000ML ARTHROMATIC (IV SOLUTION) ×4 IMPLANT
KIT TURNOVER CYSTO (KITS) ×2 IMPLANT
LOOP CUT BIPOLAR 24F LRG (ELECTROSURGICAL) ×2 IMPLANT
MANIFOLD NEPTUNE II (INSTRUMENTS) ×2 IMPLANT
PACK CYSTO (CUSTOM PROCEDURE TRAY) ×2 IMPLANT
TUBE CONNECTING 12X1/4 (SUCTIONS) ×2 IMPLANT
TUBING UROLOGY SET (TUBING) IMPLANT
WATER STERILE IRR 3000ML UROMA (IV SOLUTION) IMPLANT
WATER STERILE IRR 500ML POUR (IV SOLUTION) ×2 IMPLANT

## 2021-06-08 NOTE — Anesthesia Procedure Notes (Signed)
Procedure Name: LMA Insertion Date/Time: 06/08/2021 7:44 AM Performed by: Justice Rocher, CRNA Pre-anesthesia Checklist: Patient identified, Emergency Drugs available, Suction available, Patient being monitored and Timeout performed Patient Re-evaluated:Patient Re-evaluated prior to induction Oxygen Delivery Method: Circle system utilized Preoxygenation: Pre-oxygenation with 100% oxygen Induction Type: IV induction Ventilation: Mask ventilation without difficulty LMA: LMA inserted LMA Size: 4.0 Number of attempts: 1 Airway Equipment and Method: Bite block Placement Confirmation: positive ETCO2, breath sounds checked- equal and bilateral and CO2 detector Tube secured with: Tape Dental Injury: Teeth and Oropharynx as per pre-operative assessment

## 2021-06-08 NOTE — Interval H&P Note (Signed)
History and Physical Interval Note:  06/08/2021 7:18 AM  Kevin Buck  has presented today for surgery, with the diagnosis of BLADDER CANCER.  The various methods of treatment have been discussed with the patient and family. After consideration of risks, benefits and other options for treatment, the patient has consented to  Procedure(s): [place] (N/A) as a surgical intervention.  The patient's history has been reviewed, patient examined, no change in status, stable for surgery.  I have reviewed the patient's chart and labs.  Questions were answered to the patient's satisfaction.     [name] D Juanito Gonyer

## 2021-06-08 NOTE — OR Nursing (Signed)
Cathetor unplugged and hooked up to cathetor bag for drainage.

## 2021-06-08 NOTE — Anesthesia Postprocedure Evaluation (Signed)
Anesthesia Post Note  Patient: Kevin Buck  Procedure(s) Performed: CYSTOSCOPY WITH BIOPSY AND FULGERATION; POST OPERATIVE INSTILLATION OF GEMCITABINE (Bladder)     Patient location during evaluation: PACU Anesthesia Type: General Level of consciousness: awake Pain management: pain level controlled Vital Signs Assessment: post-procedure vital signs reviewed and stable Respiratory status: spontaneous breathing Cardiovascular status: stable Postop Assessment: no apparent nausea or vomiting Anesthetic complications: no   No notable events documented.  Last Vitals:  Vitals:   06/08/21 0851 06/08/21 0900  BP:  133/90  Pulse: 85 79  Resp: 14 15  Temp:  (!) 36.3 C  SpO2: 97% 93%    Last Pain:  Vitals:   06/08/21 0900  TempSrc: Oral  PainSc:                  Huston Foley

## 2021-06-08 NOTE — Transfer of Care (Signed)
Immediate Anesthesia Transfer of Care Note  Patient: Kevin Buck  Procedure(s) Performed: Procedure(s) (LRB): CYSTOSCOPY WITH BIOPSY AND FULGERATION (N/A)  Patient Location: PACU  Anesthesia Type: General  Level of Consciousness: awake, sedated, patient cooperative and responds to stimulation  Airway & Oxygen Therapy: Patient Spontanous Breathing and Patient connected to [place] 02 and soft FM   Post-op Assessment: Report given to PACU RN, Post -op Vital signs reviewed and stable and Patient moving all extremities  Post vital signs: Reviewed and stable  Complications: No apparent anesthesia complications

## 2021-06-08 NOTE — Discharge Instructions (Addendum)
Transurethral Resection of Bladder Tumor (TURBT)   Definition:  Transurethral Resection of the Bladder Tumor is a surgical procedure used to diagnose and remove tumors within the bladder. TURBT is the most common treatment for early stage bladder cancer.  General instructions:     Your recent bladder surgery requires very little post hospital care but some definite precautions.  Despite the fact that no skin incisions were used, the area around the bladder incisions are raw and covered with scabs to promote healing and prevent bleeding. Certain precautions are needed to insure that the scabs are not disturbed over the next 2-4 weeks while the healing proceeds.  Because the raw surface inside your bladder and the irritating effects of urine you may expect frequency of urination and/or urgency (a stronger desire to urinate) and perhaps even getting up at night more often. This will usually resolve or improve slowly over the healing period. You may see some blood in your urine over the first 6 weeks. Do not be alarmed, even if the urine was clear for a while. Get off your feet and drink lots of fluids until clearing occurs. If you start to pass clots or don't improve call us.  Catheter: (If you are discharged with a catheter.)  1. Keep your catheter secured to your leg at all times with tape or the supplied strap. 2. You may experience leakage of urine around your catheter- as long as the  catheter continues to drain, this is normal.  If your catheter stops draining  go to the ER. 3. You may also have blood in your urine, even after it has been clear for  several days; you may even pass some small blood clots or other material.  This  is normal as well.  If this happens, sit down and drink plenty of water to help  make urine to flush out your bladder.  If the blood in your urine becomes worse  after doing this, contact our office or return to the ER. 4. You may use the leg bag (small bag)  during the day, but use the large bag at  night.  Diet:  You may return to your normal diet immediately. Because of the raw surface of your bladder, alcohol, spicy foods, foods high in acid and drinks with caffeine may cause irritation or frequency and should be used in moderation. To keep your urine flowing freely and avoid constipation, drink plenty of fluids during the day (8-10 glasses). Tip: Avoid cranberry juice because it is very acidic.  Activity:  Your physical activity doesn't need to be restricted. However, if you are very active, you may see some blood in the urine. We suggest that you reduce your activity under the circumstances until the bleeding has stopped.  Bowels:  It is important to keep your bowels regular during the postoperative period. Straining with bowel movements can cause bleeding. A bowel movement every other day is reasonable. Use a mild laxative if needed, such as milk of magnesia 2-3 tablespoons, or 2 Dulcolax tablets. Call if you continue to have problems. If you had been taking narcotics for pain, before, during or after your surgery, you may be constipated. Take a laxative if necessary.    Medication:  You should resume your pre-surgery medications unless told not to. In addition you may be given an antibiotic to prevent or treat infection. Antibiotics are not always necessary. All medication should be taken as prescribed until the bottles are finished unless you are having   an unusual reaction to one of the drugs.    Post Anesthesia Home Care Instructions  Activity: Get plenty of rest for the remainder of the day. A responsible adult should stay with you for 24 hours following the procedure.  For the next 24 hours, DO NOT: -Drive a car -Operate machinery -Drink alcoholic beverages -Take any medication unless instructed by your physician -Make any legal decisions or sign important papers.  Meals: Start with liquid foods such as gelatin or soup.  Progress to regular foods as tolerated. Avoid greasy, spicy, heavy foods. If nausea and/or vomiting occur, drink only clear liquids until the nausea and/or vomiting subsides. Call your physician if vomiting continues.  Special Instructions/Symptoms: Your throat may feel dry or sore from the anesthesia or the breathing tube placed in your throat during surgery. If this causes discomfort, gargle with warm salt water. The discomfort should disappear within 24 hours.        

## 2021-06-08 NOTE — Op Note (Signed)
PATIENT:  Kevin Buck  PRE-OPERATIVE DIAGNOSIS: Bladder cancer r  POST-OPERATIVE DIAGNOSIS: Same  PROCEDURE:  Procedure(s): 1. Cystoscopye with bladder biopsy and fulguration > 2cm 2. Instillation of intravesical chemotherapy (Gemcitabine)  SURGEON:  Jacalyn Lefevre, MD  ANESTHESIA:   General  EBL:  Minimal  DRAINS: Urethral catheter (18 Fr. Foley)   SPECIMEN:  Posterior wall bladder tumor  DISPOSITION OF SPECIMEN:  PATHOLOGY  Findings: Normal anterior urethra Lateral lobe hypertrophy with high riding bladder neck and prostatic urethra Previously resected bilateral ureteral orifices Multiple TUR scars with erythema of posterior superior bladder wall  Indication:  65 year old man with a history of high-grade T1 bladder cancer diagnosed in August 2021 who completed induction BCG and had recurrence on for surveillance cystoscopy. 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 He completed second induction BCG and most recently maintenance.  Surveillance cystoscopy 05/14/2021 shows erythema of the posterior bladder wall concerning for early recurrence.  Description of operation: The patient was taken to the operating room and administered general anesthesia. They were then placed on the table and moved to the dorsal lithotomy position after which the genitalia was sterilely prepped and draped. An official timeout was then performed.  A 23 French rigid cystoscope was placed in the urethra meatus and advanced and direct visualization.  Findings noted above.  A cold cup bladder biopsy was taken of the posterior bladder wall.  The cystoscope was removed.  Next the 20 French resectoscope with the 30 lens and visual obturator were then passed into the bladder under direct visualization. Urethra appeared normal. The visual obturator was then removed and the Gyrus resectoscope element with 30  lens was then inserted.  Fulguration took place of  the posterior bladder wall which was greater than 2 cm in size.  Hemostasis was deemed adequate with the irrigant turned off.  The resectoscope was removed.   An 11 French Foley catheter was then inserted in the bladder and irrigated. The irrigant returned clear with no clots. The patient was awakened and taken to the recovery room.  While in the recovery room 2 g of gemcitabine in 52.6 cc of sterile water was instilled in the bladder through the catheter and the catheter was plugged. This will remain indwelling for approximately one hour. It will then be drained from the bladder and the catheter will be removed and the patient discharged home.  PLAN OF CARE: Discharge to home after PACU  PATIENT DISPOSITION:  PACU - hemodynamically stable.

## 2021-06-08 NOTE — Anesthesia Preprocedure Evaluation (Signed)
Anesthesia Evaluation  Patient identified by MRN, date of birth, ID band Patient awake    Reviewed: Allergy & Precautions, NPO status , Patient's Chart, lab work & pertinent test results  Airway Mallampati: III  TM Distance: >3 FB Neck ROM: Full    Dental no notable dental hx.    Pulmonary neg pulmonary ROS,    Pulmonary exam normal breath sounds clear to auscultation       Cardiovascular negative cardio ROS Normal cardiovascular exam Rhythm:Regular Rate:Normal     Neuro/Psych negative neurological ROS  negative psych ROS   GI/Hepatic negative GI ROS, Neg liver ROS,   Endo/Other  negative endocrine ROS  Renal/GU negative Renal ROS Bladder dysfunction      Musculoskeletal negative musculoskeletal ROS (+)   Abdominal (+) + obese,   Peds  Hematology negative hematology ROS (+)   Anesthesia Other Findings BLADDER CANCER  Reproductive/Obstetrics                             Anesthesia Physical  Anesthesia Plan  ASA: II  Anesthesia Plan: General   Post-op Pain Management:    Induction: Intravenous  PONV Risk Score and Plan: 2 and Ondansetron, Dexamethasone and Treatment may vary due to age or medical condition  Airway Management Planned: LMA  Additional Equipment: None  Intra-op Plan:   Post-operative Plan: Extubation in OR  Informed Consent: I have reviewed the patients History and Physical, chart, labs and discussed the procedure including the risks, benefits and alternatives for the proposed anesthesia with the patient or authorized representative who has indicated his/her understanding and acceptance.     Dental advisory given  Plan Discussed with: CRNA  Anesthesia Plan Comments:         Anesthesia Quick Evaluation

## 2021-06-08 NOTE — H&P (Signed)
CC/HPI: cc: bladder cancer    05/14/21: 65 year old man with a history of high-grade T1 bladder cancer diagnosed in August 2021 who completed induction BCG and had recurrence on for surveillance cystoscopy. 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 He completed second induction BCG and most recently maintenance. He has not tolerated maintenance BCG as well as previously. He denies any gross hematuria in interim. He is here today for surveillance cystoscopy.     ALLERGIES: NKDA    MEDICATIONS: Oxybutynin Chloride 5 mg tablet 1 tablet PO Q 8 H PRN bladder spasms while foley in place  Multivitamin  Prostate Plus [facility] Complex     GU PSH: Bladder Instill AntiCA Agent - 04/23/2021, 04/16/2021, 04/09/2021, 01/08/2021, 01/01/2021, 12/25/2020, 12/18/2020, 12/11/2020, 11/20/2020, 09/23/2020, 09/16/2020, 09/09/2020, 09/02/2020, 08/26/2020, 08/19/2020 Cystoscopy - 02/11/2021, 10/12/2020 Cystoscopy TURBT 2-5 cm - 10/20/2020, 07/14/2020 Locm 300-'399Mg'$ /Ml Iodine,1Ml - 06/11/2020     NON-GU PSH: Tonsillectomy - 1965     GU PMH: Bladder Cancer overlapping sites - 04/23/2021, - 04/16/2021, - 04/09/2021, No evidence of recurrence on surveillance cystoscopy today. Patient will proceed with maintenance BCG and follow-up cystoscopy in 3 months., - 02/11/2021, - 01/08/2021, - 01/01/2021, - 12/25/2020, - 12/18/2020, - 12/11/2020, - 11/27/2020, - 11/20/2020, Discussed pathology report with patient which showed high-grade noninvasive bladder cancer. He is a candidate for 2nd BCG induction. He will begin is in approximately 2 weeks and have this next surveillance cystoscopy in 3 months. We also discussed should he recur after a 2nd induction BCG that he would not be a candidate for more BCG. I discussed with him the natural course of bladder cancer and its tendency to recur and progress. I discussed with patient radical cystectomy however he is not ready to consider this., - 10/27/2020, - 10/12/2020, -  09/23/2020, - 09/16/2020, - 09/09/2020, - 09/02/2020, - 08/26/2020, - 08/19/2020, Patient has now undergone TURBT and restaging TURBT which confirms high-grade T1 bladder cancer. We have discussed management options and will proceed with induction BCG weekly for 6 weeks. He will then have a surveillance cystoscopy done 3 months from restaging TURBT. 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 Muscle was present but not involved. Due to pathology as well as overall tumor burden patient will be scheduled for restaging TURBT and about 3 weeks. We have already discussed the need for postoperative induction BCG if repeat pathology confirms high-grade T1. Risks and benefits of TURBT including but not limited to bleeding, pain, bladder perforation, damage to surrounding structures including urethra/prostate/ureteral orifice sees, need for Foley catheter, need for future treatment, pain all discussed with patient. Surgery scheduled for 07/14/2020., - 06/19/2020 Gross hematuria - 07/17/2020, - 07/16/2020, 22 French Foley catheter placed and irrigation attempted. No clots were irrigated however urine is moderately red still. Patient did not tolerate clot irrigation well. Based on CT scan there is large organized clot versus tumor. Discussed the risks and benefits of a cystoscopy with clot evacuation of possible TURBT. We discussed doing this today verses next week. Patient would like to proceed immediately. Risks and benefits of this discussed including but element she bleeding, infection, damage to surrounding structures, need for Foley catheter, need for future treatment, pain. Patient has agreed to proceed will be scheduled for surgery later today., - 06/11/2020 BPH w/LUTS, Will also try tamsulosin 0.4 mg daily to see if this helps his symptoms. - 06/19/2020 Nocturia -  06/19/2020 Urinary Frequency  - 06/19/2020    NON-GU PMH: Bacteriuria, Plan to send patient's urine for culture today and treat empirically so that he may start BCG on time as scheduled. - 10/27/2020    FAMILY HISTORY: Prostate Cancer - Father   SOCIAL HISTORY: Marital Status: Single Current Smoking Status: Patient has never smoked.   Tobacco Use Assessment Completed: Used Tobacco in last 30 days? Drinks 8 drinks per year. Types of alcohol consumed: Wine.     REVIEW OF SYSTEMS:    GU Review Male:   Patient denies frequent urination, hard to postpone urination, burning/ pain with urination, get up at night to urinate, leakage of urine, stream starts and stops, trouble starting your stream, have to strain to urinate , erection problems, and penile pain.  Gastrointestinal (Upper):   Patient denies nausea, vomiting, and indigestion/ heartburn.  Gastrointestinal (Lower):   Patient denies diarrhea and constipation.  Constitutional:   Patient denies fever, night sweats, weight loss, and fatigue.  Skin:   Patient denies skin rash/ lesion and itching.  Eyes:   Patient denies blurred vision and double vision.  Ears/ Nose/ Throat:   Patient denies sore throat and sinus problems.  Hematologic/Lymphatic:   Patient denies swollen glands and easy bruising.  Cardiovascular:   Patient denies leg swelling and chest pains.  Respiratory:   Patient denies shortness of breath and cough.  Endocrine:   Patient denies excessive thirst.  Musculoskeletal:   Patient denies back pain and joint pain.  Neurological:   Patient denies headaches and dizziness.  Psychologic:   Patient denies depression and anxiety.   VITAL SIGNS:      05/14/2021 02:14 PM  Weight 245 lb / 111.13 kg  BP 127/76 mmHg  Pulse 91 /min   MULTI-SYSTEM PHYSICAL EXAMINATION:    Constitutional: Well-nourished. No physical deformities. Normally developed. Good grooming.  Neck: Neck symmetrical, not swollen. Normal tracheal position.  Respiratory: No labored breathing, no  use of accessory muscles.   Skin: No paleness, no jaundice, no cyanosis. No lesion, no ulcer, no rash.  Neurologic / Psychiatric: Oriented to time, oriented to place, oriented to person. No depression, no anxiety, no agitation.  Gastrointestinal: No rigidity  Eyes: Normal conjunctivae. Normal eyelids.  Ears, Nose, Mouth, and Throat: Left ear no scars, no lesions, no masses. Right ear no scars, no lesions, no masses. Nose no scars, no lesions, no masses. Normal hearing. Normal lips.  Musculoskeletal: Normal gait and station of head and neck.     Complexity of Data:  Records Review:   Previous Patient Records, POC Tool  Urine Test Review:   Urinalysis  Notes:                     10/20/2020: BUN 16, creatinine 0.88   PROCEDURES:         Flexible Cystoscopy - 52000  Risks, benefits, and some of the potential complications of the procedure were discussed at length with the patient including infection, bleeding, voiding discomfort, urinary retention, fever, chills, sepsis, and others. All questions were answered. Informed consent was obtained. Sterile technique and intraurethral analgesia were used.  Meatus:  Normal size. Normal location. Normal condition.  Urethra:  No strictures.  External Sphincter:  Normal.  Verumontanum:  Normal.  Prostate:  Obstructing. Enlarged median lobe.   Bladder Neck:  Non-obstructing.  Ureteral Orifices:  Normal location. Normal size. Normal shape. Effluxed clear urine.  Bladder:  Multiple well-healed prior TUR scars posterior superior wall, left posterior  wall, and adjacent to right ureteral orifice. Erythema of posterior bladder wall that appears to be using difficult to say if scope trauma versus early bladder cancer recurrence.       The lower urinary tract was carefully examined. The procedure was well-tolerated and without complications. Instructions were given to call the office immediately for bloody urine, difficulty urinating, urinary retention, painful or  frequent urination, fever, chills, nausea, vomiting or other illness. The patient stated that he understood these instructions and would comply with them.         Urinalysis w/Scope Dipstick Dipstick Cont'd Micro  Color: Yellow Bilirubin: Neg mg/dL WBC/hpf: 0 - 5/hpf  Appearance: Clear Ketones: Neg mg/dL RBC/hpf: 0 - 2/hpf  Specific Gravity: >1.030 Blood: Neg ery/uL Bacteria: Rare (0-9/hpf)  pH: 5.5 Protein: Trace mg/dL Cystals: NS (Not Seen)  Glucose: Neg mg/dL Urobilinogen: 1.0 mg/dL Casts: Hyaline    Nitrites: Neg Trichomonas: Not Present    Leukocyte Esterase: Trace leu/uL Mucous: Not Present      Epithelial Cells: 0 - 5/hpf      Yeast: NS (Not Seen)      Sperm: Not Present    ASSESSMENT:      ICD-10 Details  1 GU:   Bladder Cancer overlapping sites - C67.8 Chronic, Exacerbation   PLAN:           Document Letter(s):  Created for Patient: Clinical Summary         Notes:   Cystoscopic findings discussed with the patient and recommendation for cystoscopy with bladder biopsy and fulguration. Patient has agreed to proceed. He is unsure whether not he wants to continue with BCG treatment following the bladder biopsy. Risks and benefits of the procedure discussed with the patient in detail and he like to schedule surgery.

## 2021-06-09 ENCOUNTER — Encounter (HOSPITAL_BASED_OUTPATIENT_CLINIC_OR_DEPARTMENT_OTHER): Payer: Self-pay | Admitting: Urology

## 2021-06-09 LAB — SURGICAL PATHOLOGY

## 2021-09-20 DIAGNOSIS — N401 Enlarged prostate with lower urinary tract symptoms: Secondary | ICD-10-CM | POA: Diagnosis not present

## 2021-09-20 DIAGNOSIS — C678 Malignant neoplasm of overlapping sites of bladder: Secondary | ICD-10-CM | POA: Diagnosis not present

## 2021-09-20 DIAGNOSIS — R350 Frequency of micturition: Secondary | ICD-10-CM | POA: Diagnosis not present

## 2022-01-25 DIAGNOSIS — C678 Malignant neoplasm of overlapping sites of bladder: Secondary | ICD-10-CM | POA: Diagnosis not present

## 2022-02-01 ENCOUNTER — Other Ambulatory Visit: Payer: Self-pay | Admitting: Urology

## 2022-02-11 NOTE — H&P (Signed)
/HPI: cc: bladder cancer  ? ? ?09/20/21: 66 year old man with a history of high-grade T1 bladder cancer diagnosed in August 2021 who completed induction BCG and had recurrence on for surveillance cystoscopy. 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 He completed second induction BCG and most recently maintenance. He has not tolerated maintenance BCG as well as previously. He underwent cystoscopy with biopsy Aug 2022 which was benign. No gross hematuria.  ? ?HG T1 bladder ca dx Aug 2021  ?completed induction BCG  ?Repeat TURBT Jan 2022 HG Ta bladder cancer  ?completed second induction BCG and maintenance  ?Cysto bx Aug 2022 benign  ? ?01/25/22: 66 year old man with a history of high-grade T1 bladder cancer here for surveillance cystoscopy. He also has a history of BPH with LUTS but has not quite ready to proceed with TURP.  ? ?  ?ALLERGIES: NKDA ?  ? ?MEDICATIONS: Oxybutynin Chloride 5 mg tablet 1 tablet PO Q 8 H PRN bladder spasms while foley in place  ?Multivitamin  ?Prostate Plus Health Complex  ?  ? ?GU PSH: Bladder Instill AntiCA Agent - 04/23/2021, 04/16/2021, 04/09/2021, 01/08/2021, 01/01/2021, 12/25/2020, 12/18/2020, 12/11/2020, 11/20/2020, 09/23/2020, 09/16/2020, 09/09/2020, 09/02/2020, 08/26/2020, 08/19/2020 ?Cystoscopy - 09/20/2021, 05/14/2021, 02/11/2021, 10/12/2020 ?Cystoscopy TURBT 2-5 cm - 06/08/2021, 10/20/2020, 07/14/2020 ?Locm 300-'399Mg'$ /Ml Iodine,1Ml - 06/11/2020 ? ?  ? ?NON-GU PSH: Tonsillectomy - 1965 ? ?  ? ?GU PMH: Bladder Cancer overlapping sites - 09/20/2021, - 05/14/2021, - 04/23/2021, - 04/16/2021, - 04/09/2021, No evidence of recurrence on surveillance cystoscopy today. Patient will proceed with maintenance BCG and follow-up cystoscopy in 3 months., - 02/11/2021, - 01/08/2021, - 01/01/2021, - 12/25/2020, - 12/18/2020, - 12/11/2020, - 11/27/2020, - 11/20/2020, Discussed pathology report with patient which showed high-grade noninvasive bladder cancer. He is a candidate for 2nd BCG  induction. He will begin is in approximately 2 weeks and have this next surveillance cystoscopy in 3 months. We also discussed should he recur after a 2nd induction BCG that he would not be a candidate for more BCG. I discussed with him the natural course of bladder cancer and its tendency to recur and progress. I discussed with patient radical cystectomy however he is not ready to consider this., - 10/27/2020, - 10/12/2020, - 09/23/2020, - 09/16/2020, - 09/09/2020, - 09/02/2020, - 08/26/2020, - 08/19/2020, Patient has now undergone TURBT and restaging TURBT which confirms high-grade T1 bladder cancer. We have discussed management options and will proceed with induction BCG weekly for 6 weeks. He will then have a surveillance cystoscopy done 3 months from restaging TURBT. 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 Muscle was present but not involved. Due to pathology as well as overall tumor burden patient will be scheduled for restaging TURBT and about 3 weeks. We have already discussed the need for postoperative induction BCG if repeat pathology confirms high-grade T1. Risks and benefits of TURBT including but not limited to bleeding, pain, bladder perforation, damage to surrounding structures including urethra/prostate/ureteral orifice sees, need for Foley catheter, need for future treatment, pain all discussed with patient. Surgery scheduled for 07/14/2020., - 06/19/2020 ?BPH w/LUTS - 09/20/2021, Will also try tamsulosin 0.4 mg daily to see if this helps his symptoms. , - 06/19/2020 ?Urinary Frequency - 09/20/2021, - 06/19/2020 ?Gross hematuria - 07/17/2020, - 07/16/2020, 22 French Foley catheter placed and irrigation attempted. No clots were irrigated however urine is moderately red still. Patient  did not tolerate clot irrigation well. Based on CT scan there is large organized clot versus  tumor. Discussed the risks and benefits of a cystoscopy with clot evacuation of possible TURBT. We discussed doing this today verses next week. Patient would like to proceed immediately. Risks and benefits of this discussed including but element she bleeding, infection, damage to surrounding structures, need for Foley catheter, need for future treatment, pain. Patient has agreed to proceed will be scheduled for surgery later today., - 06/11/2020 ?Nocturia - 06/19/2020 ?  ? ?NON-GU PMH: Bacteriuria, Plan to send patient's urine for culture today and treat empirically so that he may start BCG on time as scheduled. - 10/27/2020 ?  ? ?FAMILY HISTORY: Prostate Cancer - Father  ? ?SOCIAL HISTORY: Marital Status: Single ?Current Smoking Status: Patient has never smoked.  ? ?Tobacco Use Assessment Completed: Used Tobacco in last 30 days? ?Drinks 8 drinks per year. Types of alcohol consumed: Wine.  ?  ? ?REVIEW OF SYSTEMS:    ?GU Review Male:   Patient denies frequent urination, hard to postpone urination, burning/ pain with urination, get up at night to urinate, leakage of urine, stream starts and stops, trouble starting your stream, have to strain to urinate , erection problems, and penile pain.  ?Gastrointestinal (Upper):   Patient denies nausea, vomiting, and indigestion/ heartburn.  ?Gastrointestinal (Lower):   Patient denies diarrhea and constipation.  ?Constitutional:   Patient denies fever, night sweats, weight loss, and fatigue.  ?Skin:   Patient denies skin rash/ lesion and itching.  ?Eyes:   Patient denies blurred vision and double vision.  ?Ears/ Nose/ Throat:   Patient denies sore throat and sinus problems.  ?Hematologic/Lymphatic:   Patient denies swollen glands and easy bruising.  ?Cardiovascular:   Patient denies leg swelling and chest pains.  ?Respiratory:   Patient denies cough and shortness of breath.  ?Endocrine:   Patient denies excessive thirst.  ?Musculoskeletal:   Patient denies back pain and joint pain.   ?Neurological:   Patient denies headaches and dizziness.  ?Psychologic:   Patient denies depression and anxiety.  ? ?VITAL SIGNS: None  ? ?MULTI-SYSTEM PHYSICAL EXAMINATION:    ?Constitutional: Well-nourished. No physical deformities. Normally developed. Good grooming.  ?Neck: Neck symmetrical, not swollen. Normal tracheal position.  ?Respiratory: No labored breathing, no use of accessory muscles.   ?Skin: No paleness, no jaundice, no cyanosis. No lesion, no ulcer, no rash.  ?Neurologic / Psychiatric: Oriented to time, oriented to place, oriented to person. No depression, no anxiety, no agitation.  ?Eyes: Normal conjunctivae. Normal eyelids.  ?Ears, Nose, Mouth, and Throat: Left ear no scars, no lesions, no masses. Right ear no scars, no lesions, no masses. Nose no scars, no lesions, no masses. Normal hearing. Normal lips.  ?Musculoskeletal: Normal gait and station of head and neck.  ? ?  ?Complexity of Data:  ?Records Review:   Previous Patient Records, POC Tool  ?Urine Test Review:   Urinalysis  ?Notes:                     10/20/2020: BUN 16, creatinine 0.88  ? ?PROCEDURES:    ?     Flexible Cystoscopy - 52000  ?Risks, benefits, and some of the potential complications of the procedure were discussed at length with the patient including infection, bleeding, voiding discomfort, urinary retention, fever, chills, sepsis, and others. All questions were answered. Informed consent was obtained. Sterile technique and intraurethral analgesia were used.  ?Meatus:  Normal size. Normal  location. Normal condition.  ?Urethra:  No strictures.  ?External Sphincter:  Normal.  ?Verumontanum:  Normal.  ?Prostate:  Obstructing latearal lobe and tight bladder neck.   ?Bladder Neck:  Non-obstructing.  ?Ureteral Orifices:  Normal location. Normal size. Normal shape. Effluxed clear urine.  ?Bladder:  Well healed prior TUR scar. Small papillary growth superior posterior left wall concerning for early recurrence 0.5 cm  ?  ?  ?The lower  urinary tract was carefully examined. The procedure was well-tolerated and without complications. Antibiotic instructions were given. Instructions were given to call the office immediately for bloody urine, d

## 2022-02-14 ENCOUNTER — Other Ambulatory Visit: Payer: Self-pay

## 2022-02-14 ENCOUNTER — Encounter (HOSPITAL_BASED_OUTPATIENT_CLINIC_OR_DEPARTMENT_OTHER): Payer: Self-pay | Admitting: Urology

## 2022-02-14 NOTE — Progress Notes (Signed)
Spoke w/ via phone for pre-op interview---Sharod ?Lab needs dos----none                ?Lab results------none ?COVID test -----patient states asymptomatic no test needed ?Arrive at -------0530 on 02/15/22. ?NPO after MN NO Solid Food.  Clear liquids from MN until---0430 ?Med rec completed ?Medications to take morning of surgery -----none ?Diabetic medication -----n/a ?Patient instructed no nail polish to be worn day of surgery ?Patient instructed to bring photo id and insurance card day of surgery ?Patient aware to have Driver (ride ) / caregiver    for 24 hours after surgery - significant other, Bethena Roys ?Patient Special Instructions -----none ?Pre-Op special Istructions -----none ?Patient verbalized understanding of instructions that were given at this phone interview. ?Patient denies shortness of breath, chest pain, fever, cough at this phone interview.  ?

## 2022-02-15 ENCOUNTER — Ambulatory Visit (HOSPITAL_BASED_OUTPATIENT_CLINIC_OR_DEPARTMENT_OTHER): Payer: BC Managed Care – PPO | Admitting: Anesthesiology

## 2022-02-15 ENCOUNTER — Ambulatory Visit (HOSPITAL_BASED_OUTPATIENT_CLINIC_OR_DEPARTMENT_OTHER)
Admission: RE | Admit: 2022-02-15 | Discharge: 2022-02-15 | Disposition: A | Discharge status: Home / Self Care | Payer: BC Managed Care – PPO | Attending: Urology | Admitting: Urology

## 2022-02-15 ENCOUNTER — Encounter (HOSPITAL_BASED_OUTPATIENT_CLINIC_OR_DEPARTMENT_OTHER): Payer: Self-pay | Admitting: Urology

## 2022-02-15 ENCOUNTER — Encounter (HOSPITAL_BASED_OUTPATIENT_CLINIC_OR_DEPARTMENT_OTHER)
Admission: RE | Disposition: A | Discharge status: Home / Self Care | Payer: Self-pay | Source: Home / Self Care | Attending: Urology

## 2022-02-15 DIAGNOSIS — N401 Enlarged prostate with lower urinary tract symptoms: Secondary | ICD-10-CM | POA: Insufficient documentation

## 2022-02-15 DIAGNOSIS — D303 Benign neoplasm of bladder: Secondary | ICD-10-CM | POA: Insufficient documentation

## 2022-02-15 DIAGNOSIS — Z8551 Personal history of malignant neoplasm of bladder: Secondary | ICD-10-CM | POA: Diagnosis not present

## 2022-02-15 DIAGNOSIS — D494 Neoplasm of unspecified behavior of bladder: Secondary | ICD-10-CM | POA: Diagnosis not present

## 2022-02-15 DIAGNOSIS — C679 Malignant neoplasm of bladder, unspecified: Secondary | ICD-10-CM | POA: Diagnosis not present

## 2022-02-15 HISTORY — PX: TRANSURETHRAL RESECTION OF BLADDER TUMOR: SHX2575

## 2022-02-15 HISTORY — PX: CYSTOSCOPY WITH BIOPSY: SHX5122

## 2022-02-15 SURGERY — TURBT (TRANSURETHRAL RESECTION OF BLADDER TUMOR)
Anesthesia: General | Site: Bladder

## 2022-02-15 MED ORDER — LACTATED RINGERS IV SOLN
INTRAVENOUS | Status: DC
  Administered 2022-02-15: 1000 mL via INTRAVENOUS

## 2022-02-15 MED ORDER — PROPOFOL 10 MG/ML IV BOLUS
INTRAVENOUS | Status: AC
  Filled 2022-02-15: qty 20

## 2022-02-15 MED ORDER — LIDOCAINE HCL (PF) 2 % IJ SOLN
INTRAMUSCULAR | Status: AC
  Filled 2022-02-15: qty 5

## 2022-02-15 MED ORDER — 0.9 % SODIUM CHLORIDE (POUR BTL) OPTIME
TOPICAL | Status: DC | PRN
  Administered 2022-02-15: 500 mL

## 2022-02-15 MED ORDER — SODIUM CHLORIDE 0.9 % IR SOLN
Status: DC | PRN
Start: 2022-02-15 — End: 2022-02-15
  Administered 2022-02-15: 4500 mL

## 2022-02-15 MED ORDER — CEFAZOLIN SODIUM-DEXTROSE 2-4 GM/100ML-% IV SOLN
INTRAVENOUS | Status: AC
  Filled 2022-02-15: qty 100

## 2022-02-15 MED ORDER — KETOROLAC TROMETHAMINE 30 MG/ML IJ SOLN: 30.0000 mg | Freq: Once | INTRAMUSCULAR | Status: DC | PRN

## 2022-02-15 MED ORDER — HYDROMORPHONE HCL 1 MG/ML IJ SOLN
0.2500 mg | INTRAMUSCULAR | Status: DC | PRN
  Administered 2022-02-15: 0.25 mg via INTRAVENOUS

## 2022-02-15 MED ORDER — ONDANSETRON HCL 4 MG/2ML IJ SOLN: 4.0000 mg | Freq: Once | INTRAMUSCULAR | Status: DC | PRN

## 2022-02-15 MED ORDER — LIDOCAINE 2% (20 MG/ML) 5 ML SYRINGE
INTRAMUSCULAR | Status: DC | PRN
Start: 2022-02-15 — End: 2022-02-15
  Administered 2022-02-15: 80 mg via INTRAVENOUS

## 2022-02-15 MED ORDER — FENTANYL CITRATE (PF) 100 MCG/2ML IJ SOLN
INTRAMUSCULAR | Status: AC
Start: 2022-02-15 — End: ?
  Filled 2022-02-15: qty 2

## 2022-02-15 MED ORDER — PROPOFOL 10 MG/ML IV BOLUS
INTRAVENOUS | Status: DC | PRN
  Administered 2022-02-15: 200 mg via INTRAVENOUS

## 2022-02-15 MED ORDER — GEMCITABINE CHEMO FOR BLADDER INSTILLATION 2000 MG
2000.0000 mg | Freq: Once | INTRAVENOUS | Status: AC
  Administered 2022-02-15: 2000 mg via INTRAVESICAL
  Filled 2022-02-15: qty 2000
  Filled 2022-02-15: qty 52.6

## 2022-02-15 MED ORDER — METHYLENE BLUE 1 % INJ SOLN
INTRAVENOUS | Status: DC | PRN
  Administered 2022-02-15: 1 mL via INTRAVENOUS

## 2022-02-15 MED ORDER — HYDROMORPHONE HCL 1 MG/ML IJ SOLN
INTRAMUSCULAR | Status: AC
  Filled 2022-02-15: qty 1

## 2022-02-15 MED ORDER — MIDAZOLAM HCL 5 MG/5ML IJ SOLN
INTRAMUSCULAR | Status: DC | PRN
  Administered 2022-02-15: 2 mg via INTRAVENOUS

## 2022-02-15 MED ORDER — OXYCODONE HCL 5 MG/5ML PO SOLN: 5.0000 mg | Freq: Once | ORAL | Status: DC | PRN

## 2022-02-15 MED ORDER — FENTANYL CITRATE (PF) 100 MCG/2ML IJ SOLN
INTRAMUSCULAR | Status: DC | PRN
  Administered 2022-02-15: 25 ug via INTRAVENOUS
  Administered 2022-02-15: 50 ug via INTRAVENOUS
  Administered 2022-02-15: 25 ug via INTRAVENOUS

## 2022-02-15 MED ORDER — MIDAZOLAM HCL 2 MG/2ML IJ SOLN
INTRAMUSCULAR | Status: AC
  Filled 2022-02-15: qty 2

## 2022-02-15 MED ORDER — DEXAMETHASONE SODIUM PHOSPHATE 10 MG/ML IJ SOLN
INTRAMUSCULAR | Status: AC
  Filled 2022-02-15: qty 1

## 2022-02-15 MED ORDER — OXYCODONE HCL 5 MG PO TABS: 5.0000 mg | ORAL_TABLET | Freq: Once | ORAL | Status: DC | PRN

## 2022-02-15 MED ORDER — ONDANSETRON HCL 4 MG/2ML IJ SOLN
INTRAMUSCULAR | Status: DC | PRN
  Administered 2022-02-15: 4 mg via INTRAVENOUS

## 2022-02-15 MED ORDER — CEFAZOLIN SODIUM-DEXTROSE 2-4 GM/100ML-% IV SOLN
2.0000 g | INTRAVENOUS | Status: AC
  Administered 2022-02-15: 2 g via INTRAVENOUS

## 2022-02-15 MED ORDER — DEXAMETHASONE SODIUM PHOSPHATE 4 MG/ML IJ SOLN
INTRAMUSCULAR | Status: DC | PRN
  Administered 2022-02-15: 10 mg via INTRAVENOUS

## 2022-02-15 MED ORDER — ONDANSETRON HCL 4 MG/2ML IJ SOLN
INTRAMUSCULAR | Status: AC
  Filled 2022-02-15: qty 2

## 2022-02-15 SURGICAL SUPPLY — 24 items
BAG DRAIN URO-CYSTO SKYTR STRL (DRAIN) ×2 IMPLANT
BAG DRN RND TRDRP ANRFLXCHMBR (UROLOGICAL SUPPLIES) ×1
BAG DRN UROCATH (DRAIN) ×1
BAG URINE DRAIN 2000ML AR STRL (UROLOGICAL SUPPLIES) ×1 IMPLANT
CATH COUDE FOLEY 2W 5CC 18FR (CATHETERS) ×1 IMPLANT
CATH FOLEY 2WAY SLVR  5CC 18FR (CATHETERS)
CATH FOLEY 2WAY SLVR 5CC 18FR (CATHETERS) IMPLANT
CLOTH BEACON ORANGE TIMEOUT ST (SAFETY) ×2 IMPLANT
ELECT REM PT RETURN 9FT ADLT (ELECTROSURGICAL) ×2
ELECTRODE REM PT RTRN 9FT ADLT (ELECTROSURGICAL) ×1 IMPLANT
GLOVE BIO SURGEON STRL SZ 6.5 (GLOVE) ×2 IMPLANT
GOWN STRL REUS W/TWL LRG LVL3 (GOWN DISPOSABLE) ×2 IMPLANT
HOLDER FOLEY CATH W/STRAP (MISCELLANEOUS) IMPLANT
IV NS IRRIG 3000ML ARTHROMATIC (IV SOLUTION) ×2 IMPLANT
KIT TURNOVER CYSTO (KITS) ×2 IMPLANT
LOOP CUT BIPOLAR 24F LRG (ELECTROSURGICAL) ×1 IMPLANT
MANIFOLD NEPTUNE II (INSTRUMENTS) ×2 IMPLANT
NS IRRIG 500ML POUR BTL (IV SOLUTION) ×1 IMPLANT
PACK CYSTO (CUSTOM PROCEDURE TRAY) ×2 IMPLANT
SYR TOOMEY IRRIG 70ML (MISCELLANEOUS) ×2
SYRINGE TOOMEY IRRIG 70ML (MISCELLANEOUS) ×1 IMPLANT
TUBE CONNECTING 12X1/4 (SUCTIONS) ×2 IMPLANT
TUBING UROLOGY SET (TUBING) ×2 IMPLANT
WATER STERILE IRR 3000ML UROMA (IV SOLUTION) ×2 IMPLANT

## 2022-02-15 NOTE — Anesthesia Procedure Notes (Signed)
Procedure Name: LMA Insertion ?Date/Time: 02/15/2022 7:41 AM ?Performed by: Justice Rocher, CRNA ?Pre-anesthesia Checklist: Patient identified, Emergency Drugs available, Suction available, Patient being monitored and Timeout performed ?Patient Re-evaluated:Patient Re-evaluated prior to induction ?Oxygen Delivery Method: Circle system utilized ?Preoxygenation: Pre-oxygenation with 100% oxygen ?Induction Type: IV induction ?Ventilation: Mask ventilation without difficulty ?LMA: LMA inserted ?LMA Size: 5.0 ?Number of attempts: 1 ?Airway Equipment and Method: Bite block ?Placement Confirmation: positive ETCO2, breath sounds checked- equal and bilateral and CO2 detector ?Tube secured with: Tape ?Dental Injury: Teeth and Oropharynx as per pre-operative assessment  ? ? ? ? ?

## 2022-02-15 NOTE — Anesthesia Preprocedure Evaluation (Signed)
Anesthesia Evaluation  ?Patient identified by MRN, date of birth, ID band ?Patient awake ? ? ? ?Reviewed: ?Allergy & Precautions, NPO status , Patient's Chart, lab work & pertinent test results ? ?Airway ?Mallampati: II ? ?TM Distance: >3 FB ?Neck ROM: Full ? ? ? Dental ?no notable dental hx. ? ?  ?Pulmonary ?neg pulmonary ROS,  ?  ?Pulmonary exam normal ?breath sounds clear to auscultation ? ? ? ? ? ? Cardiovascular ?negative cardio ROS ?Normal cardiovascular exam ?Rhythm:Regular Rate:Normal ? ? ?  ?Neuro/Psych ?negative neurological ROS ? negative psych ROS  ? GI/Hepatic ?negative GI ROS, Neg liver ROS,   ?Endo/Other  ?negative endocrine ROS ? Renal/GU ?negative Renal ROS  ?negative genitourinary ?  ?Musculoskeletal ?negative musculoskeletal ROS ?(+)  ? Abdominal ?  ?Peds ?negative pediatric ROS ?(+)  Hematology ?negative hematology ROS ?(+)   ?Anesthesia Other Findings ? ? Reproductive/Obstetrics ?negative OB ROS ? ?  ? ? ? ? ? ? ? ? ? ? ? ? ? ?  ?  ? ? ? ? ? ? ? ? ?Anesthesia Physical ?Anesthesia Plan ? ?ASA: 2 ? ?Anesthesia Plan: General  ? ?Post-op Pain Management: Minimal or no pain anticipated  ? ?Induction: Intravenous ? ?PONV Risk Score and Plan: 2 and Ondansetron, Dexamethasone and Treatment may vary due to age or medical condition ? ?Airway Management Planned: LMA and Oral ETT ? ?Additional Equipment:  ? ?Intra-op Plan:  ? ?Post-operative Plan: Extubation in OR ? ?Informed Consent: I have reviewed the patients History and Physical, chart, labs and discussed the procedure including the risks, benefits and alternatives for the proposed anesthesia with the patient or authorized representative who has indicated his/her understanding and acceptance.  ? ? ? ?Dental advisory given ? ?Plan Discussed with: CRNA and Surgeon ? ?Anesthesia Plan Comments: (Will ask surgeon if relaxation is necessary for LMA vs ETT)  ? ? ? ? ? ? ?Anesthesia Quick Evaluation ? ?

## 2022-02-15 NOTE — Transfer of Care (Signed)
Immediate Anesthesia Transfer of Care Note ? ?Patient: Kevin Buck ? ?Procedure(s) Performed: Procedure(s) (LRB): ?TRANSURETHRAL RESECTION OF BLADDER TUMOR (TURBT) (N/A) ?CYSTOSCOPY WITH BIOPSY (N/A) ? ?Patient Location: PACU ? ?Anesthesia Type: General ? ?Level of Consciousness: awake, sedated, patient cooperative and responds to stimulation ? ?Airway & Oxygen Therapy: Patient Spontanous Breathing and Patient connected to [place] 02 oxygen ? ?Post-op Assessment: Report given to PACU RN, Post -op Vital signs reviewed and stable and Patient moving all extremities ? ?Post vital signs: Reviewed and stable ? ?Complications: No apparent anesthesia complications ?

## 2022-02-15 NOTE — Discharge Instructions (Addendum)
Post Bladder Surgery Instructions ? ? ?General instructions: ?   ? Your recent bladder surgery requires very little post hospital care but some definite precautions. ? ?Despite the fact that no skin incisions were used, the area around the bladder incisions are raw and covered with scabs to promote healing and prevent bleeding. Certain precautions are needed to insure that the scabs are not disturbed over the next 2-4 weeks while the healing proceeds. ? ?Because the raw surface inside your bladder and the irritating effects of urine you may expect frequency of urination and/or urgency (a stronger desire to urinate) and perhaps even getting up at night more often. This will usually resolve or improve slowly over the healing period. You may see some blood in your urine over the first 6 weeks. Do not be alarmed, even if the urine was clear for a while. Get off your feet and drink lots of fluids until clearing occurs. If you start to pass clots or don't improve call us. ? ?Catheter: (If you are discharged with a catheter.) ? ?1. Keep your catheter secured to your leg at all times with tape or the supplied strap. ?2. You may experience leakage of urine around your catheter- as long as the  ?catheter continues to drain, this is normal.  If your catheter stops draining  ?go to the ER. ?3. You may also have blood in your urine, even after it has been clear for  ?several days; you may even pass some small blood clots or other material.  This  ?is normal as well.  If this happens, sit down and drink plenty of water to help  ?make urine to flush out your bladder.  If the blood in your urine becomes worse  ?after doing this, contact our office or return to the ER. ?4. You may use the leg bag (small bag) during the day, but use the large bag at  ?night. ? ?Diet: ? ?You may return to your normal diet immediately. Because of the raw surface of your bladder, alcohol, spicy foods, foods high in acid and drinks with caffeine may  cause irritation or frequency and should be used in moderation. To keep your urine flowing freely and avoid constipation, drink plenty of fluids during the day (8-10 glasses). Tip: Avoid cranberry juice because it is very acidic. ? ?Activity: ? ?Your physical activity doesn't need to be restricted. However, if you are very active, you may see some blood in the urine. We suggest that you reduce your activity under the circumstances until the bleeding has stopped. ? ?Bowels: ? ?It is important to keep your bowels regular during the postoperative period. Straining with bowel movements can cause bleeding. A bowel movement every other day is reasonable. Use a mild laxative if needed, such as milk of magnesia 2-3 tablespoons, or 2 Dulcolax tablets. Call if you continue to have problems. If you had been taking narcotics for pain, before, during or after your surgery, you may be constipated. Take a laxative if necessary. ? ? ? ?Medication: ? ?You should resume your pre-surgery medications unless told not to. In addition you may be given an antibiotic to prevent or treat infection. Antibiotics are not always necessary. All medication should be taken as prescribed until the bottles are finished unless you are having an unusual reaction to one of the drugs. ? ?AZO - over the counter can be used to help burning with urination. ? ? ? ?Post Anesthesia Home Care Instructions ? ?Activity: ?Get plenty of  rest for the remainder of the day. A responsible individual must stay with you for 24 hours following the procedure.  ?For the next 24 hours, DO NOT: ?-Drive a car ?-Paediatric nurse ?-Drink alcoholic beverages ?-Take any medication unless instructed by your physician ?-Make any legal decisions or sign important papers. ? ?Meals: ?Start with liquid foods such as gelatin or soup. Progress to regular foods as tolerated. Avoid greasy, spicy, heavy foods. If nausea and/or vomiting occur, drink only clear liquids until the nausea and/or  vomiting subsides. Call your physician if vomiting continues. ? ?Special Instructions/Symptoms: ?Your throat may feel dry or sore from the anesthesia or the breathing tube placed in your throat during surgery. If this causes discomfort, gargle with warm salt water. The discomfort should disappear within 24 hours. ? ? ?    ? ?

## 2022-02-15 NOTE — Anesthesia Postprocedure Evaluation (Signed)
Anesthesia Post Note ? ?Patient: Kevin Buck ? ?Procedure(s) Performed: TRANSURETHRAL RESECTION OF BLADDER TUMOR (TURBT) (Bladder) ?CYSTOSCOPY WITH BIOPSY (Bladder) ? ?  ? ?Patient location during evaluation: PACU ?Anesthesia Type: General ?Level of consciousness: awake and alert ?Pain management: pain level controlled ?Vital Signs Assessment: post-procedure vital signs reviewed and stable ?Respiratory status: spontaneous breathing, nonlabored ventilation, respiratory function stable and patient connected to nasal cannula oxygen ?Cardiovascular status: blood pressure returned to baseline and stable ?Postop Assessment: no apparent nausea or vomiting ?Anesthetic complications: no ? ? ?No notable events documented. ? ?Last Vitals:  ?Vitals:  ? 02/15/22 0845 02/15/22 0900  ?BP: (!) 141/85 129/81  ?Pulse: 83 71  ?Resp: 16 (!) 22  ?Temp:    ?SpO2: 95% 91%  ?  ?Last Pain:  ?Vitals:  ? 02/15/22 0845  ?TempSrc:   ?PainSc: 0-No pain  ? ? ?  ?  ?  ?  ?  ?  ? ?Aubri Gathright S ? ? ? ? ?

## 2022-02-15 NOTE — Op Note (Signed)
PATIENT:  Kevin Buck ? ?PRE-OPERATIVE DIAGNOSIS: Bladder tumor ? ?POST-OPERATIVE DIAGNOSIS: Same ? ?PROCEDURE:  Procedure(s): ?1. Cystoscopy with bladder biopsy and fulguration ?2. Instillation of intravesical chemotherapy (Gemcitabine) ? ?SURGEON:  Jacalyn Lefevre, MD ? ?ANESTHESIA:   General ? ?EBL:  Minimal ? ?DRAINS: Urethral catheter (18 Fr. Foley)  ? ?SPECIMEN:   ?Left posterior wall bladder tumor ?2.   Right posterior wall bladder tumor ? ?Findings: ?Normal anterior urethra ?Lateral lobe prostatic hypertrophy without significant intravesical median lobe ?Previously resected left ureteral orifice wide open, right ureteral orifice difficult to see but is effluxing clear urine ?Early papillary regrowth on posterior bladder wall adjacent to prior TUR scars ?Cold cup bladder biopsy obtained of left posterior wall regrowth and right posterior wall regrowth ? ?DISPOSITION OF SPECIMEN:  PATHOLOGY ? ?Indication: 66 year old man with a history of high-grade T1 bladder cancer diagnosed in August 2021 with recurrence in January 2022 found to have a early recurrence on surveillance cystoscopy. ? ?Description of operation: The patient was taken to the operating room and administered general anesthesia. They were then placed on the table and moved to the dorsal lithotomy position after which the genitalia was sterilely prepped and draped. An official timeout was then performed. ? ?A 23 French rigid cystoscope was placed in the urethral meatus and advanced in the bladder and direct visualization.  Findings noted above.  Cold cup bladder biopsy was obtained of the left posterior bladder wall and right posterior bladder wall in the areas of concern.  Next the cystoscope was removed. ? ?The 17 French resectoscope with the 30? lens and visual obturator were then passed into the bladder under direct visualization. Urethra appeared normal. The visual obturator was then removed and the Gyrus resectoscope element with 30 ?  lens was then inserted and the bladder was fully and systematically inspected.  ? ?The bipolar loop was then used to fulgurate the biopsy areas as well as surrounding areas of superficial papillary growth.  Care was taken to avoid the ureteral orifices. ? ? ? ?Reinspection of the bladder revealed all obvious tumor had been fully resected and there was no evidence of perforation.  Hemostasis was adequate with irrigant turned off.  I then removed the resectoscope. ? ?A 18 French Foley catheter was then inserted in the bladder and irrigated. The irrigant returned slightly pink with no clots. The patient was awakened and taken to the recovery room. ? ?While in the recovery room 2 g of gemcitabine in 52.6 cc of sterile water was instilled in the bladder through the catheter and the catheter was plugged. This will remain indwelling for approximately one hour. It will then be drained from the bladder and the catheter will be removed and the patient discharged home. ? ?PLAN OF CARE: Discharge to home after PACU ? ?PATIENT DISPOSITION:  PACU - hemodynamically stable.  ?

## 2022-02-15 NOTE — Interval H&P Note (Signed)
History and Physical Interval Note: ? ?02/15/2022 ?7:24 AM ? ?Kevin Buck  has presented today for surgery, with the diagnosis of BLADDER CANCER.  The various methods of treatment have been discussed with the patient and family. After consideration of risks, benefits and other options for treatment, the patient has consented to  Procedure(s): ?TRANSURETHRAL RESECTION OF BLADDER TUMOR (TURBT) (N/A) ?CYSTOSCOPY WITH BIOPSY (N/A) as a surgical intervention.  The patient's history has been reviewed, patient examined, no change in status, stable for surgery.  I have reviewed the patient's chart and labs.  Questions were answered to the patient's satisfaction.   ? ? ?Anan Dapolito D Anslie Spadafora ? ? ?

## 2022-02-16 ENCOUNTER — Encounter (HOSPITAL_BASED_OUTPATIENT_CLINIC_OR_DEPARTMENT_OTHER): Payer: Self-pay | Admitting: Urology

## 2022-02-16 LAB — SURGICAL PATHOLOGY

## 2022-02-23 DIAGNOSIS — N401 Enlarged prostate with lower urinary tract symptoms: Secondary | ICD-10-CM | POA: Diagnosis not present

## 2022-02-23 DIAGNOSIS — C678 Malignant neoplasm of overlapping sites of bladder: Secondary | ICD-10-CM | POA: Diagnosis not present

## 2022-02-23 DIAGNOSIS — R350 Frequency of micturition: Secondary | ICD-10-CM | POA: Diagnosis not present

## 2022-03-29 DIAGNOSIS — Z5111 Encounter for antineoplastic chemotherapy: Secondary | ICD-10-CM | POA: Diagnosis not present

## 2022-03-29 DIAGNOSIS — C678 Malignant neoplasm of overlapping sites of bladder: Secondary | ICD-10-CM | POA: Diagnosis not present

## 2022-04-05 DIAGNOSIS — C678 Malignant neoplasm of overlapping sites of bladder: Secondary | ICD-10-CM | POA: Diagnosis not present

## 2022-04-05 DIAGNOSIS — Z5111 Encounter for antineoplastic chemotherapy: Secondary | ICD-10-CM | POA: Diagnosis not present

## 2022-04-12 DIAGNOSIS — Z5111 Encounter for antineoplastic chemotherapy: Secondary | ICD-10-CM | POA: Diagnosis not present

## 2022-04-12 DIAGNOSIS — C678 Malignant neoplasm of overlapping sites of bladder: Secondary | ICD-10-CM | POA: Diagnosis not present

## 2022-06-03 DIAGNOSIS — C678 Malignant neoplasm of overlapping sites of bladder: Secondary | ICD-10-CM | POA: Diagnosis not present

## 2022-06-03 DIAGNOSIS — N5201 Erectile dysfunction due to arterial insufficiency: Secondary | ICD-10-CM | POA: Diagnosis not present

## 2022-07-17 DIAGNOSIS — I499 Cardiac arrhythmia, unspecified: Secondary | ICD-10-CM

## 2022-07-17 HISTORY — DX: Cardiac arrhythmia, unspecified: I49.9

## 2022-07-31 ENCOUNTER — Encounter: Payer: Self-pay | Admitting: *Deleted

## 2022-07-31 ENCOUNTER — Emergency Department (HOSPITAL_COMMUNITY)
Admission: EM | Admit: 2022-07-31 | Discharge: 2022-07-31 | Discharge status: Against Medical Advice | Payer: BC Managed Care – PPO | Attending: Emergency Medicine | Admitting: Emergency Medicine

## 2022-07-31 ENCOUNTER — Ambulatory Visit
Admission: EM | Admit: 2022-07-31 | Discharge: 2022-07-31 | Disposition: A | Discharge status: Home / Self Care | Payer: BC Managed Care – PPO | Attending: Urgent Care | Admitting: Urgent Care

## 2022-07-31 ENCOUNTER — Encounter (HOSPITAL_COMMUNITY): Payer: Self-pay

## 2022-07-31 ENCOUNTER — Ambulatory Visit (INDEPENDENT_AMBULATORY_CARE_PROVIDER_SITE_OTHER): Payer: BC Managed Care – PPO

## 2022-07-31 ENCOUNTER — Other Ambulatory Visit: Payer: Self-pay

## 2022-07-31 ENCOUNTER — Emergency Department (HOSPITAL_COMMUNITY): Payer: BC Managed Care – PPO

## 2022-07-31 DIAGNOSIS — R0789 Other chest pain: Secondary | ICD-10-CM

## 2022-07-31 DIAGNOSIS — Z5321 Procedure and treatment not carried out due to patient leaving prior to being seen by health care provider: Secondary | ICD-10-CM | POA: Diagnosis not present

## 2022-07-31 DIAGNOSIS — J9811 Atelectasis: Secondary | ICD-10-CM | POA: Diagnosis not present

## 2022-07-31 DIAGNOSIS — R079 Chest pain, unspecified: Secondary | ICD-10-CM | POA: Diagnosis not present

## 2022-07-31 DIAGNOSIS — R1110 Vomiting, unspecified: Secondary | ICD-10-CM | POA: Diagnosis not present

## 2022-07-31 DIAGNOSIS — R110 Nausea: Secondary | ICD-10-CM | POA: Diagnosis not present

## 2022-07-31 LAB — CBC
HCT: 47.2 % (ref 39.0–52.0)
Hemoglobin: 16 g/dL (ref 13.0–17.0)
MCH: 29.7 pg (ref 26.0–34.0)
MCHC: 33.9 g/dL (ref 30.0–36.0)
MCV: 87.6 fL (ref 80.0–100.0)
Platelets: 227 10*3/uL (ref 150–400)
RBC: 5.39 MIL/uL (ref 4.22–5.81)
RDW: 11.9 % (ref 11.5–15.5)
WBC: 9.4 10*3/uL (ref 4.0–10.5)
nRBC: 0 % (ref 0.0–0.2)

## 2022-07-31 LAB — BASIC METABOLIC PANEL
Anion gap: 9 (ref 5–15)
BUN: 17 mg/dL (ref 8–23)
CO2: 23 mmol/L (ref 22–32)
Calcium: 8.7 mg/dL — ABNORMAL LOW (ref 8.9–10.3)
Chloride: 104 mmol/L (ref 98–111)
Creatinine, Ser: 0.96 mg/dL (ref 0.61–1.24)
GFR, Estimated: >60 mL/min (ref >60)
Glucose, Bld: 184 mg/dL — ABNORMAL HIGH (ref 70–99)
Potassium: 4.1 mmol/L (ref 3.5–5.1)
Sodium: 136 mmol/L (ref 135–145)

## 2022-07-31 LAB — TROPONIN I (HIGH SENSITIVITY): Troponin I (High Sensitivity): 8 ng/L (ref <18)

## 2022-07-31 MED ORDER — CYCLOBENZAPRINE HCL 5 MG PO TABS: 5.0000 mg | ORAL_TABLET | Freq: Three times a day (TID) | ORAL | 0 refills | Status: DC | PRN

## 2022-07-31 MED ORDER — ACETAMINOPHEN 325 MG PO TABS: 650.0000 mg | ORAL_TABLET | Freq: Four times a day (QID) | ORAL | 0 refills | Status: DC | PRN

## 2022-07-31 NOTE — ED Triage Notes (Signed)
States yesterday around 1100 started with generalized chest pain with one episode of vomiting. Went to ED and had EKG and labs drawn, but left without being seen @ approx 0200 this AM. Took 2 ASA early this AM. States got up this AM with pain 80-90% gone, but pain started to return within approx 1 hr of waking. now getting worse; describes as constant. C/O intermittent nausea, but no vomiting today. Denies any dyspnea, lightheadedness or dizziness. Has had coughing "spells" x "months".

## 2022-07-31 NOTE — ED Triage Notes (Signed)
Pt states that he began to have CP today around noon with some nausea, denies SOB or dizziness

## 2022-07-31 NOTE — ED Provider Notes (Signed)
[facility]  Note:  This document was prepared using Systems analyst and may include unintentional dictation errors.  MRN: [id number] DOB: 05/06/1956  Subjective:   Kevin Buck is a 66 y.o. male presenting for 1 day history of intermittent moderate left-sided chest pain.  He had 2 distinct episodes 1 that started yesterday night and had 1 episode of vomiting associated with it.  This led him to go to the emergency room.  He had blood work and an EKG, chest x-ray done.  Patient left without being seen after triage.  His pain ended up resolving almost completely but then 2 hours prior to coming into the clinic started back up and has been more constant.  Has had associated nausea but no vomiting.  No neck pain, left arm pain, jaw pain, abdominal pain, diaphoresis.  No history of heart disease, MI.  Patient is not a smoker.  No diabetes.  He does have a history of bladder cancer that was treated in 2021.  No history of pulmonary embolism.  Has a history of acid reflux but this was remote, more than 20 years ago.  However, his wife would like recommendations for seen a gastroenterologist for recheck.  Patient denies any abdominal bloating, burping.  He does endorse that at times when he eats he feels like the food gets stuck in his esophagus.  However, this is very intermittent and not persistent.  Does not have a PCP.  The only other detail is that patient works with [employer].  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  No current facility-administered medications for this encounter.  Current Outpatient Medications:    Multiple Vitamin (MULTIVITAMIN) tablet, Take 1 tablet by mouth daily., Disp: , Rfl:    Specialty Vitamins Products (PROSTATE PO), Take 1 tablet by mouth daily., Disp: , Rfl:   Facility-Administered Medications Ordered in Other Encounters:    gemcitabine (GEMZAR)  chemo syringe for bladder instillation 2,000 mg, 2,000 mg, Bladder Instillation, Once, Robley Fries, MD   No Known Allergies  Past Medical History:  Diagnosis Date   Bladder cancer [facility])    urologist-- dr m. pace,  dx 08/ 2021   History of COVID-19 05/18/2021   positive results in epic;  per pt mild symptoms that resolved     Past Surgical History:  Procedure Laterality Date   CYSTOSCOPY WITH BIOPSY N/A 06/08/2021   Procedure: CYSTOSCOPY WITH BIOPSY AND FULGERATION; POST OPERATIVE INSTILLATION OF GEMCITABINE;  Surgeon: Robley Fries, MD;  Location: [facility];  Service: Urology;  Laterality: N/A;   CYSTOSCOPY WITH BIOPSY N/A 02/15/2022   Procedure: CYSTOSCOPY WITH BIOPSY;  Surgeon: Robley Fries, MD;  Location: [facility];  Service: Urology;  Laterality: N/A;   TONSILLECTOMY  1965   TRANSURETHRAL RESECTION OF BLADDER TUMOR N/A 06/11/2020   Procedure: CYSTO CLOT EVACUATION  TRANSURETHRAL RESECTION OF BLADDER TUMOR (TURBT);  Surgeon: Robley Fries, MD;  Location: [facility];  Service: Urology;  Laterality: N/A;   TRANSURETHRAL RESECTION OF BLADDER TUMOR N/A 10/20/2020   Procedure: TRANSURETHRAL RESECTION OF BLADDER TUMOR (TURBT);  Surgeon: Robley Fries, MD;  Location: [facility];  Service: Urology;  Laterality: N/A;   TRANSURETHRAL RESECTION OF BLADDER TUMOR N/A 02/15/2022   Procedure: TRANSURETHRAL RESECTION OF BLADDER TUMOR (TURBT);  Surgeon: Robley Fries, MD;  Location: [facility];  Service: Urology;  Laterality: N/A;   TRANSURETHRAL RESECTION OF BLADDER TUMOR WITH MITOMYCIN-C N/A 07/14/2020   Procedure: RESTAGING TRANSURETHRAL RESECTION OF BLADDER TUMOR;  Surgeon: Robley Fries, MD;  Location: [facility];  Service: Urology;  Laterality: N/A;   [place]    History reviewed. No pertinent family history.  Social History   Tobacco Use   Smoking status: Never   Smokeless tobacco:  Never  Vaping Use   Vaping Use: Never used  Substance Use Topics   Alcohol use: Not Currently   Drug use: Never    ROS   Objective:   Vitals: BP (!) 145/90   Pulse 94   Resp (!) 22   SpO2 94%   Physical Exam Constitutional:      General: He is not in acute distress.    Appearance: Normal appearance. He is well-developed and normal weight. He is not ill-appearing, toxic-appearing or diaphoretic.  HENT:     Head: Normocephalic and atraumatic.     Right Ear: External ear normal.     Left Ear: External ear normal.     Nose: Nose normal.     Mouth/Throat:     Mouth: Mucous membranes are moist.  Eyes:     General: No scleral icterus.       Right eye: No discharge.        Left eye: No discharge.     Extraocular Movements: Extraocular movements intact.     Conjunctiva/sclera: Conjunctivae normal.  Cardiovascular:     Rate and Rhythm: Normal rate and regular rhythm.     Heart sounds: Normal heart sounds. No murmur heard.    No friction rub. No gallop.  Pulmonary:     Effort: Pulmonary effort is normal. No respiratory distress.     Breath sounds: Normal breath sounds. No stridor. No wheezing, rhonchi or rales.  Chest:     Chest wall: No tenderness.  Abdominal:     General: Bowel sounds are normal. There is no distension.     Palpations: Abdomen is soft. There is no mass.     Tenderness: There is no abdominal tenderness. There is no right CVA tenderness, left CVA tenderness, guarding or rebound.  Skin:    General: Skin is warm and dry.  Neurological:     Mental Status: He is alert and oriented to person, place, and time.  Psychiatric:        Mood and Affect: Mood normal.        Behavior: Behavior normal.        Thought Content: Thought content normal.     ED ECG REPORT   Date: 07/31/2022  EKG Time: 12:25 PM  Rate: 93bpm  Rhythm: normal sinus rhythm and premature atrial contractions (PAC),  unchanged from previous tracings  Axis: left  Intervals:none  ST&T  Change: T-wave flattening in III, t-wave inversion in aVL.   Narrative Interpretation: sinus rhythm at 93 with premature atrial contractions and nonspecific T wave changes as above.  No acute changes from the EKG completed last night.    DG Chest 2 View  Result Date: 07/31/2022 CLINICAL DATA:  66 year old male with chest pain and vomiting since 1100 hours yesterday. EXAM: CHEST - 2 VIEW COMPARISON:  Chest radiographs 0100 hours today. FINDINGS: Lung volumes are improved from earlier today. Mediastinal contours are normal. Visualized tracheal air column is within normal limits. Both lungs appear clear. No pneumothorax or pleural effusion. No acute osseous abnormality identified. Negative visible  bowel gas. IMPRESSION: Improved lung volumes since 0100 hours and no acute cardiopulmonary abnormality. Electronically Signed   By: Genevie Ann M.D.   On: 07/31/2022 12:31   DG Chest 2 View  Result Date: 07/31/2022 CLINICAL DATA:  Chest pain EXAM: CHEST - 2 VIEW COMPARISON:  12/29/2015 FINDINGS: Linear densities in the left base, likely atelectasis. Right lung clear. Heart is normal size. Mediastinal contours within normal limits. No acute bony abnormality. IMPRESSION: Left base atelectasis. Electronically Signed   By: Rolm Baptise M.D.   On: 07/31/2022 01:06     Results for orders placed or performed during the hospital encounter of 07/31/22 (from the past 24 hour(s))  Basic metabolic panel     Status: Abnormal   Collection Time: 07/31/22  1:00 AM  Result Value Ref Range   Sodium 136 135 - 145 mmol/L   Potassium 4.1 3.5 - 5.1 mmol/L   Chloride 104 98 - 111 mmol/L   CO2 23 22 - 32 mmol/L   Glucose, Bld 184 (H) 70 - 99 mg/dL   BUN 17 8 - 23 mg/dL   Creatinine, Ser 0.96 0.61 - 1.24 mg/dL   Calcium 8.7 (L) 8.9 - 10.3 mg/dL   GFR, Estimated >60 >60 mL/min   Anion gap 9 5 - 15  CBC     Status: None   Collection Time: 07/31/22  1:00 AM  Result Value Ref Range   WBC 9.4 4.0 - 10.5 K/uL   RBC 5.39 4.22 -  5.81 MIL/uL   Hemoglobin 16.0 13.0 - 17.0 g/dL   HCT 47.2 39.0 - 52.0 %   MCV 87.6 80.0 - 100.0 fL   MCH 29.7 26.0 - 34.0 pg   MCHC 33.9 30.0 - 36.0 g/dL   RDW 11.9 11.5 - 15.5 %   Platelets 227 150 - 400 K/uL   nRBC 0.0 0.0 - 0.2 %  Troponin I (High Sensitivity)     Status: None   Collection Time: 07/31/22  1:00 AM  Result Value Ref Range   Troponin I (High Sensitivity) 8 <18 ng/L    Assessment and Plan :   PDMP not reviewed this encounter.  1. Atypical chest pain    Differential reviewed extensively with patient which includes developing ACS, pulmonary embolism.  I have low suspicion for these diagnoses at this time.  However, discussed the life-threatening nature of such diagnoses and emphasized need to maintain strict ER precautions.  For now we will manage for musculoskeletal chest pain related to the strenuous nature of the work he performed Friday.  Start Tylenol, use Flexeril.  Rest.  Establish care with a new PCP.  With [facility] gastroenterology regarding recheck on GERD. Counseled patient on potential for adverse effects with medications prescribed/recommended today, ER and return-to-clinic precautions discussed, patient verbalized understanding.     Jaynee Eagles, PA-C 07/31/22 1302

## 2022-08-29 DIAGNOSIS — D125 Benign neoplasm of sigmoid colon: Secondary | ICD-10-CM | POA: Diagnosis not present

## 2022-08-29 DIAGNOSIS — D122 Benign neoplasm of ascending colon: Secondary | ICD-10-CM | POA: Diagnosis not present

## 2022-08-29 DIAGNOSIS — Z1211 Encounter for screening for malignant neoplasm of colon: Secondary | ICD-10-CM | POA: Diagnosis not present

## 2022-08-29 DIAGNOSIS — K5730 Diverticulosis of large intestine without perforation or abscess without bleeding: Secondary | ICD-10-CM | POA: Diagnosis not present

## 2022-08-29 DIAGNOSIS — K635 Polyp of colon: Secondary | ICD-10-CM | POA: Diagnosis not present

## 2022-08-29 DIAGNOSIS — K648 Other hemorrhoids: Secondary | ICD-10-CM | POA: Diagnosis not present

## 2022-08-29 DIAGNOSIS — D123 Benign neoplasm of transverse colon: Secondary | ICD-10-CM | POA: Diagnosis not present

## 2022-08-29 HISTORY — PX: COLONOSCOPY WITH PROPOFOL: SHX5780

## 2022-09-02 DIAGNOSIS — C678 Malignant neoplasm of overlapping sites of bladder: Secondary | ICD-10-CM | POA: Diagnosis not present

## 2022-09-02 DIAGNOSIS — R351 Nocturia: Secondary | ICD-10-CM | POA: Diagnosis not present

## 2022-09-02 DIAGNOSIS — N401 Enlarged prostate with lower urinary tract symptoms: Secondary | ICD-10-CM | POA: Diagnosis not present

## 2022-09-02 DIAGNOSIS — R350 Frequency of micturition: Secondary | ICD-10-CM | POA: Diagnosis not present

## 2022-09-06 ENCOUNTER — Other Ambulatory Visit: Payer: Self-pay | Admitting: Urology

## 2022-09-06 DIAGNOSIS — R350 Frequency of micturition: Secondary | ICD-10-CM | POA: Diagnosis not present

## 2022-09-06 DIAGNOSIS — N401 Enlarged prostate with lower urinary tract symptoms: Secondary | ICD-10-CM | POA: Diagnosis not present

## 2022-09-23 ENCOUNTER — Encounter (HOSPITAL_BASED_OUTPATIENT_CLINIC_OR_DEPARTMENT_OTHER): Payer: Self-pay | Admitting: Urology

## 2022-09-23 NOTE — Progress Notes (Signed)
Spoke w/ via phone for pre-op interview--- pt Lab needs dos----  no             Lab results------ no COVID test -----patient states asymptomatic no test needed Arrive at ------- 1030 on 09-27-2022 NPO after MN NO Solid Food.  Clear liquids from MN until---  0930 Med rec completed Medications to take morning of surgery ----- none Diabetic medication -----  n/a Patient instructed no nail polish to be worn day of surgery Patient instructed to bring photo id and insurance card day of surgery Patient aware to have Driver (ride ) / caregiver for 24 hours after surgery --- sig other, judy Patient Special Instructions ----- reviewed [facility] and visitor guidelines Pre-Op special Istructions ----- n/a Patient verbalized understanding of instructions that were given at this phone interview. Patient denies shortness of breath, chest pain, fever, cough at this phone interview.

## 2022-09-26 NOTE — Progress Notes (Signed)
Patient called in and asked if he could drink grape and apple juice in the morning prior to surgery. I instructed him to only drink clear liquids such as water, tea, black coffee, and soda. Avoid milk and milk products. Patient verbalized understanding.

## 2022-09-27 ENCOUNTER — Ambulatory Visit (HOSPITAL_BASED_OUTPATIENT_CLINIC_OR_DEPARTMENT_OTHER)
Admission: RE | Admit: 2022-09-27 | Discharge: 2022-09-28 | Disposition: A | Discharge status: Home / Self Care | Payer: BC Managed Care – PPO | Source: Ambulatory Visit | Attending: Urology | Admitting: Urology

## 2022-09-27 ENCOUNTER — Ambulatory Visit (HOSPITAL_BASED_OUTPATIENT_CLINIC_OR_DEPARTMENT_OTHER): Payer: BC Managed Care – PPO | Admitting: Anesthesiology

## 2022-09-27 ENCOUNTER — Other Ambulatory Visit: Payer: Self-pay

## 2022-09-27 ENCOUNTER — Encounter (HOSPITAL_BASED_OUTPATIENT_CLINIC_OR_DEPARTMENT_OTHER): Payer: Self-pay | Admitting: Urology

## 2022-09-27 ENCOUNTER — Encounter (HOSPITAL_BASED_OUTPATIENT_CLINIC_OR_DEPARTMENT_OTHER)
Admission: RE | Disposition: A | Discharge status: Home / Self Care | Payer: Self-pay | Source: Ambulatory Visit | Attending: Urology

## 2022-09-27 DIAGNOSIS — N4289 Other specified disorders of prostate: Secondary | ICD-10-CM | POA: Diagnosis not present

## 2022-09-27 DIAGNOSIS — N320 Bladder-neck obstruction: Secondary | ICD-10-CM | POA: Diagnosis not present

## 2022-09-27 DIAGNOSIS — C679 Malignant neoplasm of bladder, unspecified: Secondary | ICD-10-CM | POA: Insufficient documentation

## 2022-09-27 DIAGNOSIS — Z8042 Family history of malignant neoplasm of prostate: Secondary | ICD-10-CM | POA: Insufficient documentation

## 2022-09-27 DIAGNOSIS — C61 Malignant neoplasm of prostate: Secondary | ICD-10-CM | POA: Insufficient documentation

## 2022-09-27 DIAGNOSIS — Z8551 Personal history of malignant neoplasm of bladder: Secondary | ICD-10-CM | POA: Diagnosis not present

## 2022-09-27 DIAGNOSIS — N400 Enlarged prostate without lower urinary tract symptoms: Secondary | ICD-10-CM | POA: Diagnosis not present

## 2022-09-27 DIAGNOSIS — N138 Other obstructive and reflux uropathy: Secondary | ICD-10-CM | POA: Diagnosis not present

## 2022-09-27 DIAGNOSIS — Z01818 Encounter for other preprocedural examination: Secondary | ICD-10-CM

## 2022-09-27 DIAGNOSIS — R350 Frequency of micturition: Secondary | ICD-10-CM | POA: Insufficient documentation

## 2022-09-27 DIAGNOSIS — N401 Enlarged prostate with lower urinary tract symptoms: Secondary | ICD-10-CM | POA: Diagnosis not present

## 2022-09-27 HISTORY — PX: TRANSURETHRAL RESECTION OF PROSTATE: SHX73

## 2022-09-27 HISTORY — DX: Benign prostatic hyperplasia with lower urinary tract symptoms: N40.1

## 2022-09-27 HISTORY — PX: CYSTOSCOPY WITH FULGERATION: SHX6638

## 2022-09-27 HISTORY — DX: Other chest pain: R07.89

## 2022-09-27 SURGERY — TURP (TRANSURETHRAL RESECTION OF PROSTATE)
Anesthesia: General | Site: Urethra

## 2022-09-27 MED ORDER — CEFAZOLIN SODIUM-DEXTROSE 2-4 GM/100ML-% IV SOLN
INTRAVENOUS | Status: AC
  Filled 2022-09-27: qty 100

## 2022-09-27 MED ORDER — 0.9 % SODIUM CHLORIDE (POUR BTL) OPTIME
TOPICAL | Status: DC | PRN
  Administered 2022-09-27: 500 mL

## 2022-09-27 MED ORDER — METHYLENE BLUE 1 % INJ SOLN
INTRAVENOUS | Status: DC | PRN
  Administered 2022-09-27: 10 mL via INTRAVENOUS

## 2022-09-27 MED ORDER — ONDANSETRON HCL 4 MG/2ML IJ SOLN: 4.0000 mg | INTRAMUSCULAR | Status: DC | PRN

## 2022-09-27 MED ORDER — MIDAZOLAM HCL 2 MG/2ML IJ SOLN
INTRAMUSCULAR | Status: DC | PRN
  Administered 2022-09-27: 2 mg via INTRAVENOUS

## 2022-09-27 MED ORDER — MORPHINE SULFATE (PF) 2 MG/ML IV SOLN
INTRAVENOUS | Status: AC
  Filled 2022-09-27: qty 1

## 2022-09-27 MED ORDER — FENTANYL CITRATE (PF) 100 MCG/2ML IJ SOLN
INTRAMUSCULAR | Status: AC
  Filled 2022-09-27: qty 2

## 2022-09-27 MED ORDER — OXYCODONE HCL 5 MG PO TABS
ORAL_TABLET | ORAL | Status: AC
  Filled 2022-09-27: qty 1

## 2022-09-27 MED ORDER — OXYCODONE HCL 5 MG PO TABS: 5.0000 mg | ORAL_TABLET | Freq: Once | ORAL | Status: DC | PRN

## 2022-09-27 MED ORDER — AMISULPRIDE (ANTIEMETIC) 5 MG/2ML IV SOLN: 10.0000 mg | Freq: Once | INTRAVENOUS | Status: DC | PRN

## 2022-09-27 MED ORDER — ONDANSETRON HCL 4 MG/2ML IJ SOLN
INTRAMUSCULAR | Status: DC | PRN
  Administered 2022-09-27: 4 mg via INTRAVENOUS

## 2022-09-27 MED ORDER — OXYCODONE HCL 5 MG PO TABS
5.0000 mg | ORAL_TABLET | ORAL | Status: DC | PRN
  Administered 2022-09-27 – 2022-09-28 (×4): 5 mg via ORAL

## 2022-09-27 MED ORDER — TRIPLE ANTIBIOTIC 3.5-400-5000 EX OINT: 1.0000 | TOPICAL_OINTMENT | Freq: Three times a day (TID) | CUTANEOUS | Status: DC | PRN

## 2022-09-27 MED ORDER — DIPHENHYDRAMINE HCL 50 MG/ML IJ SOLN: 12.5000 mg | Freq: Four times a day (QID) | INTRAMUSCULAR | Status: DC | PRN

## 2022-09-27 MED ORDER — LIDOCAINE HCL (CARDIAC) PF 100 MG/5ML IV SOSY
PREFILLED_SYRINGE | INTRAVENOUS | Status: DC | PRN
  Administered 2022-09-27: 50 mg via INTRAVENOUS

## 2022-09-27 MED ORDER — SODIUM CHLORIDE 0.9% FLUSH: 3.0000 mL | Freq: Two times a day (BID) | INTRAVENOUS | Status: DC

## 2022-09-27 MED ORDER — SODIUM CHLORIDE 0.9% FLUSH: 3.0000 mL | INTRAVENOUS | Status: DC | PRN

## 2022-09-27 MED ORDER — DEXAMETHASONE SODIUM PHOSPHATE 4 MG/ML IJ SOLN
INTRAMUSCULAR | Status: DC | PRN
  Administered 2022-09-27: 8 mg via INTRAVENOUS

## 2022-09-27 MED ORDER — FENTANYL CITRATE (PF) 100 MCG/2ML IJ SOLN: 25.0000 ug | INTRAMUSCULAR | Status: DC | PRN

## 2022-09-27 MED ORDER — CEFAZOLIN SODIUM-DEXTROSE 2-4 GM/100ML-% IV SOLN
2.0000 g | INTRAVENOUS | Status: AC
  Administered 2022-09-27: 2 g via INTRAVENOUS

## 2022-09-27 MED ORDER — ACETAMINOPHEN 160 MG/5ML PO SOLN: 325.0000 mg | ORAL | Status: DC | PRN

## 2022-09-27 MED ORDER — STERILE WATER FOR IRRIGATION IR SOLN
Status: DC | PRN
  Administered 2022-09-27: 500 mL

## 2022-09-27 MED ORDER — DIPHENHYDRAMINE HCL 12.5 MG/5ML PO ELIX: 12.5000 mg | ORAL_SOLUTION | Freq: Four times a day (QID) | ORAL | Status: DC | PRN

## 2022-09-27 MED ORDER — LACTATED RINGERS IV SOLN: INTRAVENOUS | Status: DC

## 2022-09-27 MED ORDER — ADULT MULTIVITAMIN W/MINERALS CH
1.0000 | ORAL_TABLET | Freq: Every day | ORAL | Status: DC
  Administered 2022-09-27: 1 via ORAL
  Filled 2022-09-27 (×2): qty 1

## 2022-09-27 MED ORDER — ACETAMINOPHEN 10 MG/ML IV SOLN: 1000.0000 mg | Freq: Once | INTRAVENOUS | Status: DC | PRN

## 2022-09-27 MED ORDER — SODIUM CHLORIDE 0.9 % IR SOLN
Status: DC | PRN
  Administered 2022-09-27 (×3): 6000 mL
  Administered 2022-09-27: 3000 mL

## 2022-09-27 MED ORDER — OXYCODONE HCL 5 MG/5ML PO SOLN: 5.0000 mg | Freq: Once | ORAL | Status: DC | PRN

## 2022-09-27 MED ORDER — SODIUM CHLORIDE 0.9 % IR SOLN: 3000.0000 mL | Status: DC

## 2022-09-27 MED ORDER — ACETAMINOPHEN 325 MG PO TABS
650.0000 mg | ORAL_TABLET | ORAL | Status: DC | PRN
  Administered 2022-09-28: 650 mg via ORAL

## 2022-09-27 MED ORDER — FENTANYL CITRATE (PF) 100 MCG/2ML IJ SOLN
INTRAMUSCULAR | Status: DC | PRN
  Administered 2022-09-27: 25 ug via INTRAVENOUS
  Administered 2022-09-27: 50 ug via INTRAVENOUS
  Administered 2022-09-27: 25 ug via INTRAVENOUS

## 2022-09-27 MED ORDER — SODIUM CHLORIDE 0.9 % IV SOLN: INTRAVENOUS | Status: DC

## 2022-09-27 MED ORDER — MORPHINE SULFATE (PF) 4 MG/ML IV SOLN: 2.0000 mg | INTRAVENOUS | Status: DC | PRN

## 2022-09-27 MED ORDER — ACETAMINOPHEN 325 MG PO TABS
ORAL_TABLET | ORAL | Status: AC
  Filled 2022-09-27: qty 2

## 2022-09-27 MED ORDER — PROPOFOL 10 MG/ML IV BOLUS
INTRAVENOUS | Status: DC | PRN
  Administered 2022-09-27: 200 mg via INTRAVENOUS

## 2022-09-27 MED ORDER — CYCLOBENZAPRINE HCL 5 MG PO TABS
5.0000 mg | ORAL_TABLET | Freq: Three times a day (TID) | ORAL | Status: DC | PRN
  Administered 2022-09-27: 5 mg via ORAL
  Filled 2022-09-27: qty 1

## 2022-09-27 MED ORDER — SENNOSIDES-DOCUSATE SODIUM 8.6-50 MG PO TABS
2.0000 | ORAL_TABLET | Freq: Every day | ORAL | Status: DC
  Administered 2022-09-27: 2 via ORAL
  Filled 2022-09-27 (×2): qty 2

## 2022-09-27 MED ORDER — MIDAZOLAM HCL 2 MG/2ML IJ SOLN
INTRAMUSCULAR | Status: AC
  Filled 2022-09-27: qty 2

## 2022-09-27 MED ORDER — METHYLENE BLUE 1 % INJ SOLN
INTRAVENOUS | Status: AC
  Filled 2022-09-27: qty 10

## 2022-09-27 MED ORDER — SODIUM CHLORIDE 0.9 % IV SOLN: 250.0000 mL | INTRAVENOUS | Status: DC | PRN

## 2022-09-27 MED ORDER — ACETAMINOPHEN 325 MG PO TABS
325.0000 mg | ORAL_TABLET | ORAL | Status: DC | PRN
  Administered 2022-09-27: 650 mg via ORAL

## 2022-09-27 SURGICAL SUPPLY — 23 items
BAG DRAIN URO-CYSTO SKYTR STRL (DRAIN) ×2 IMPLANT
BAG DRN RND TRDRP ANRFLXCHMBR (UROLOGICAL SUPPLIES) ×2
BAG DRN UROCATH (DRAIN) ×2
BAG URINE DRAIN 2000ML AR STRL (UROLOGICAL SUPPLIES) ×2 IMPLANT
CATH HEMA 3WAY 30CC 22FR COUDE (CATHETERS) ×2 IMPLANT
CLOTH BEACON ORANGE TIMEOUT ST (SAFETY) ×2 IMPLANT
GLOVE BIO SURGEON STRL SZ 6.5 (GLOVE) ×2 IMPLANT
GLOVE BIOGEL PI IND STRL 7.0 (GLOVE) IMPLANT
GOWN STRL REUS W/ TWL LRG LVL3 (GOWN DISPOSABLE) IMPLANT
GOWN STRL REUS W/TWL LRG LVL3 (GOWN DISPOSABLE) ×4 IMPLANT
HOLDER FOLEY CATH W/STRAP (MISCELLANEOUS) ×2 IMPLANT
IV NS IRRIG 3000ML ARTHROMATIC (IV SOLUTION) ×4 IMPLANT
KIT TURNOVER CYSTO (KITS) ×2 IMPLANT
LOOP CUT BIPOLAR 24F LRG (ELECTROSURGICAL) IMPLANT
MANIFOLD NEPTUNE II (INSTRUMENTS) ×2 IMPLANT
NS IRRIG 500ML POUR BTL (IV SOLUTION) IMPLANT
PACK CYSTO (CUSTOM PROCEDURE TRAY) ×2 IMPLANT
SYR 30ML LL (SYRINGE) IMPLANT
SYR TOOMEY IRRIG 70ML (MISCELLANEOUS) ×2
SYRINGE TOOMEY IRRIG 70ML (MISCELLANEOUS) ×2 IMPLANT
TUBE CONNECTING 12X1/4 (SUCTIONS) ×2 IMPLANT
TUBING UROLOGY SET (TUBING) ×2 IMPLANT
WATER STERILE IRR 500ML POUR (IV SOLUTION) IMPLANT

## 2022-09-27 NOTE — Transfer of Care (Signed)
Immediate Anesthesia Transfer of Care Note  Patient: Kevin Buck  Procedure(s) Performed: TRANSURETHRAL RESECTION OF THE PROSTATE (TURP) (Prostate) CYSTOSCOPY WITH FULGERATION (Urethra)  Patient Location: PACU  Anesthesia Type:General  Level of Consciousness: awake, alert , and patient cooperative  Airway & Oxygen Therapy: Patient Spontanous Breathing and Patient connected to nasal cannula oxygen  Post-op Assessment: Report given to RN and Post -op Vital signs reviewed and stable  Post vital signs: Reviewed and stable  Last Vitals:  Vitals Value Taken Time  BP 132/96 09/27/22 1340  Temp    Pulse 95 09/27/22 1341  Resp 19 09/27/22 1341  SpO2 96 % 09/27/22 1341  Vitals shown include unvalidated device data.  Last Pain:  Vitals:   09/27/22 1106  TempSrc: Oral  PainSc: 0-No pain      Patients Stated Pain Goal: 4 (30/07/62 2633)  Complications: No notable events documented.

## 2022-09-27 NOTE — Discharge Instructions (Signed)

## 2022-09-27 NOTE — Anesthesia Postprocedure Evaluation (Signed)
Anesthesia Post Note  Patient: Kevin Buck  Procedure(s) Performed: TRANSURETHRAL RESECTION OF THE PROSTATE (TURP) (Prostate) CYSTOSCOPY WITH FULGERATION (Urethra)     Patient location during evaluation: PACU Anesthesia Type: General Level of consciousness: awake and alert Pain management: pain level controlled Vital Signs Assessment: post-procedure vital signs reviewed and stable Respiratory status: spontaneous breathing, nonlabored ventilation, respiratory function stable and patient connected to nasal cannula oxygen Cardiovascular status: blood pressure returned to baseline and stable Postop Assessment: no apparent nausea or vomiting Anesthetic complications: no   No notable events documented.  Last Vitals:  Vitals:   09/27/22 1430 09/27/22 1445  BP: 115/80 (!) 120/90  Pulse: 75 84  Resp: 13 16  Temp:    SpO2: 91% 93%    Last Pain:  Vitals:   09/27/22 1445  TempSrc:   PainSc: 3                  Effie Berkshire

## 2022-09-27 NOTE — Anesthesia Preprocedure Evaluation (Addendum)
Anesthesia Evaluation  Patient identified by MRN, date of birth, ID band Patient awake    Reviewed: Allergy & Precautions, NPO status , Patient's Chart, lab work & pertinent test results  Airway Mallampati: II  TM Distance: >3 FB Neck ROM: Full    Dental  (+) Teeth Intact, Dental Advisory Given   Pulmonary neg pulmonary ROS   breath sounds clear to auscultation       Cardiovascular negative cardio ROS  Rhythm:Regular Rate:Normal     Neuro/Psych negative neurological ROS  negative psych ROS   GI/Hepatic negative GI ROS, Neg liver ROS,,,  Endo/Other  negative endocrine ROS    Renal/GU negative Renal ROS     Musculoskeletal negative musculoskeletal ROS (+)    Abdominal   Peds  Hematology negative hematology ROS (+)   Anesthesia Other Findings   Reproductive/Obstetrics                             Anesthesia Physical Anesthesia Plan  ASA: 2  Anesthesia Plan: General   Post-op Pain Management:    Induction: Intravenous  PONV Risk Score and Plan: 3 and Ondansetron, Dexamethasone and Midazolam  Airway Management Planned: LMA  Additional Equipment: None  Intra-op Plan:   Post-operative Plan: Extubation in OR  Informed Consent: I have reviewed the patients History and Physical, chart, labs and discussed the procedure including the risks, benefits and alternatives for the proposed anesthesia with the patient or authorized representative who has indicated his/her understanding and acceptance.     Dental advisory given  Plan Discussed with: CRNA  Anesthesia Plan Comments:        Anesthesia Quick Evaluation

## 2022-09-27 NOTE — H&P (Signed)
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 He completed second induction BCG and most recently maintenance. He has not tolerated maintenance BCG as well as previously. He underwent cystoscopy with biopsy Aug 2022 which was benign. No gross hematuria.   HG T1 bladder ca dx Aug 2021  completed induction BCG  Repeat TURBT Jan 2022 HG Ta bladder cancer  completed second induction BCG and maintenance  Cysto bx Aug 2022 benign  Cysto bx May 2023 benign  Maintenance BCG June 2023   01/25/22: 66 year old man with a history of high-grade T1 bladder cancer here for surveillance cystoscopy. He also has a history of BPH with LUTS but has not quite ready to proceed with TURP.   02/23/2022: 66 year old man with a history of high-grade T1 bladder cancer underwent cystoscopy with bladder biopsy and fulguration last week. Pathology was benign.   06/03/22: 66 year old male with a history of high-grade T1 bladder cancer who is undergone BCG induction x2 here for follow-up. Last recurrence was January 2022.   06/03/22: 66 yo man with hx of HG T1 bladder cancer. Last recurrence January 2022. Currently on maintenance BCG.   09/02/2022: 66 year old man with history of high-grade T1 bladder cancer. Here for surveillance cystoscopy. He is interested in proceeding with TURP. He does have a history of BPH with LUTS.     ALLERGIES: NKDA    MEDICATIONS: Oxybutynin Chloride 5 mg tablet 1 tablet PO Q 8 H PRN bladder spasms while foley in place  Multivitamin  Prostate Plus Health Complex  Sildenafil Citrate 20 mg tablet 1 tablet PO Daily PRN     GU PSH: Bladder Instill AntiCA Agent - 04/12/2022, 04/05/2022, 03/29/2022, 04/23/2021, 04/16/2021, 04/09/2021, 2022, 2022, 2022,  2022, 2022, 2022, 09/23/2020, 09/16/2020, 09/09/2020, 2021, 2021, 2021 Cystoscopy - 06/03/2022, 01/25/2022, 09/20/2021, 05/14/2021, 2022, 10/12/2020 Cystoscopy TURBT <2 cm - 02/15/2022 Cystoscopy TURBT 2-5 cm - 06/08/2021, 2022, 2021 Locm 300-'399Mg'$ /Ml Iodine,1Ml - 2021     NON-GU PSH: Tonsillectomy - 1964     GU PMH: Bladder Cancer overlapping sites - 06/03/2022, - 04/12/2022, - 04/05/2022, - 03/29/2022, - 02/23/2022, - 01/25/2022, - 09/20/2021, - 05/14/2021, - 04/23/2021, - 04/16/2021, - 04/09/2021, No evidence of recurrence on surveillance cystoscopy today. Patient will proceed with maintenance BCG and follow-up cystoscopy in 3 months., - 2022, - 2022, - 2022, - 2022, - 2022, - 2022, - 2022, - 2022, Discussed pathology report with patient which showed high-grade noninvasive bladder cancer. He is a candidate for 2nd BCG induction. He will begin is in approximately 2 weeks and have this next surveillance cystoscopy in 3 months. We also discussed should he recur after a 2nd induction BCG that he would not be a candidate for more BCG. I discussed with him the natural course of bladder cancer and its tendency to recur and progress. I discussed with patient radical cystectomy however he is not ready to consider this., - 2022, - 10/12/2020, - 09/23/2020, - 09/16/2020, - 09/09/2020, - 2021, - 2021, - 2021, Patient has now undergone TURBT and restaging TURBT which confirms high-grade T1 bladder cancer. We have discussed management options and will proceed with induction BCG weekly for 6 weeks. He will then have a surveillance cystoscopy done 3 months from restaging TURBT. We discussed the side effects of BCG and he voiced understanding., -  2021, - 2021, - 2021, Pathology shows high-grade pT1 urothelial cell carcinoma of the bladder. Muscle was present but not involved. Due to pathology as well as overall tumor burden patient will be scheduled for restaging TURBT and about 3 weeks. We have already discussed the need for postoperative  induction BCG if repeat pathology confirms high-grade T1. Risks and benefits of TURBT including but not limited to bleeding, pain, bladder perforation, damage to surrounding structures including urethra/prostate/ureteral orifice sees, need for Foley catheter, need for future treatment, pain all discussed with patient. Surgery scheduled for 07/14/2020., - 2021 ED due to arterial insufficiency - 06/03/2022 BPH w/LUTS - 02/23/2022, - 09/20/2021, Will also try tamsulosin 0.4 mg daily to see if this helps his symptoms. , - 2021 Urinary Frequency - 02/23/2022, - 09/20/2021, - 2021 Gross hematuria - 2021, - 2021, 22 French Foley catheter placed and irrigation attempted. No clots were irrigated however urine is moderately red still. Patient did not tolerate clot irrigation well. Based on CT scan there is large organized clot versus tumor. Discussed the risks and benefits of a cystoscopy with clot evacuation of possible TURBT. We discussed doing this today verses next week. Patient would like to proceed immediately. Risks and benefits of this discussed including but element she bleeding, infection, damage to surrounding structures, need for Foley catheter, need for future treatment, pain. Patient has agreed to proceed will be scheduled for surgery later today., - 2021 Nocturia - 2021    NON-GU PMH: Bacteriuria, Plan to send patient's urine for culture today and treat empirically so that he may start BCG on time as scheduled. - 2022    FAMILY HISTORY: Prostate Cancer - Father   SOCIAL HISTORY: Marital Status: Single Current Smoking Status: Patient has never smoked.   Tobacco Use Assessment Completed: Used Tobacco in last 30 days? Drinks 8 drinks per year. Types of alcohol consumed: Wine.     REVIEW OF SYSTEMS:    GU Review Male:   Patient denies frequent urination, hard to postpone urination, burning/ pain with urination, get up at night to urinate, leakage of urine, stream starts and stops, trouble starting  your stream, have to strain to urinate , erection problems, and penile pain.  Gastrointestinal (Upper):   Patient denies nausea, vomiting, and indigestion/ heartburn.  Gastrointestinal (Lower):   Patient denies diarrhea and constipation.  Constitutional:   Patient denies fever, night sweats, weight loss, and fatigue.  Skin:   Patient denies skin rash/ lesion and itching.  Eyes:   Patient denies blurred vision and double vision.  Ears/ Nose/ Throat:   Patient denies sore throat and sinus problems.  Hematologic/Lymphatic:   Patient denies swollen glands and easy bruising.  Cardiovascular:   Patient denies leg swelling and chest pains.  Respiratory:   Patient denies cough and shortness of breath.  Endocrine:   Patient denies excessive thirst.  Musculoskeletal:   Patient denies back pain and joint pain.  Neurological:   Patient denies headaches and dizziness.  Psychologic:   Patient denies depression and anxiety.   VITAL SIGNS: None   MULTI-SYSTEM PHYSICAL EXAMINATION:    Constitutional: Well-nourished. No physical deformities. Normally developed. Good grooming.  Neck: Neck symmetrical, not swollen. Normal tracheal position.  Respiratory: No labored breathing, no use of accessory muscles.   Skin: No paleness, no jaundice, no cyanosis. No lesion, no ulcer, no rash.  Neurologic / Psychiatric: Oriented to time, oriented to place, oriented to person. No depression, no anxiety, no agitation.  Eyes: Normal conjunctivae. Normal eyelids.  Ears, Nose, Mouth, and Throat: Left ear no scars, no lesions, no masses. Right ear no scars, no lesions, no masses. Nose no scars, no lesions, no masses. Normal hearing. Normal lips.  Musculoskeletal: Normal gait and station of head and neck.     Complexity of Data:  Records Review:   Previous Patient Records, POC Tool  Urine Test Review:   Urinalysis  Notes:                     07/31/2022: BUN 17, creatinine 0.96   PROCEDURES:          Urinalysis Dipstick  Dipstick Cont'd  Color: Yellow Bilirubin: Neg mg/dL  Appearance: Clear Ketones: Neg mg/dL  Specific Gravity: 1.025 Blood: Neg ery/uL  pH: 5.5 Protein: Neg mg/dL  Glucose: Trace mg/dL Urobilinogen: 0.2 mg/dL    Nitrites: Neg    Leukocyte Esterase: Neg leu/uL    ASSESSMENT:      ICD-10 Details  1 GU:   Bladder Cancer overlapping sites - C67.8 Chronic, Stable  2   BPH w/LUTS - N40.1 Chronic, Stable  3   Urinary Frequency - R35.0 Chronic, Stable  4   Nocturia - R35.1 Chronic, Stable   PLAN:           Schedule Labs: 1 Week - PSA with Reflex          Document Letter(s):  Created for Patient: Clinical Summary         Notes:   1. Bladder cancer  -No evidence of recurrence today  -Recommend continuing with maintenance BCG   2. BPH with LUTS:  -We discussed risks and benefits of TURP including but not limited to pain, bleeding, infection, damage strength structures, retrograde ejaculation, increased urgency, need for Foley catheter, postoperative limitations  -Patient wishes to proceed with surgery and was scheduled for the next available date  -Need to see if he has PSA recently

## 2022-09-27 NOTE — Op Note (Signed)
Preoperative diagnosis: Bladder outlet obstruction secondary to BPH History of bladder cancer  Postoperative diagnosis:  Bladder outlet obstruction secondary to BPH History of bladder cancer  Procedure:  Cystoscopy Transurethral resection of the prostate Fulguration of bladder mucosa 1.5 cm  Surgeon: Jacalyn Lefevre, MD  Anesthesia: General  Complications: None  EBL: Minimal  Findings: Normal anterior urethra Bilateral Uos visualized - left UO displaced laterally and previous resected, right UO displaced superior and medially (methylene blue given to help identify) Lateral lobe prostatic hyperplasia Prior TUR scars in bladder with mild erythma  Specimens: Prostate chips  Indication: Kevin Buck is a patient with bladder outlet obstruction secondary to benign prostatic hyperplasia. After reviewing the management options for treatment, he elected to proceed with the above surgical procedure(s). We have discussed the potential benefits and risks of the procedure, side effects of the proposed treatment, the likelihood of the patient achieving the goals of the procedure, and any potential problems that might occur during the procedure or recuperation. Informed consent has been obtained.  Description of procedure:  The patient was taken to the operating room and general anesthesia was induced.  The patient was placed in the dorsal lithotomy position, prepped and draped in the usual sterile fashion, and preoperative antibiotics were administered. A preoperative time-out was performed.   Cystourethroscopy was performed.  The patient's urethra was examined and demonstrated bilobar prostatic hypertrophy.  The bladder was then systematically examined in its entirety. There was no evidence of any bladder tumors, or stones. There was some erythema surrounding prior TUR site.  This area was fulgurated with bipolar loop.  Bilateral ureteral orifices were identified after methylene blue  was given. See findings for further description.  The prostate adenoma was then resected utilizing loop cautery resection with the bipolar cutting loop.  The prostate adenoma from the bladder neck back to the verumontanum was resected beginning at the six o'clock position and then extended to include the right and left lobes of the prostate and anterior prostate. Care was taken not to resect distal to the verumontanum.   Hemostasis was then achieved with the cautery and the bladder was emptied and reinspected with no significant bleeding noted at the end of the procedure.    A 22 3-way catheter was then placed into the bladder.  The patient appeared to tolerate the procedure well and without complications.  The patient was able to be awakened and transferred to the recovery unit in satisfactory condition.

## 2022-09-27 NOTE — Anesthesia Procedure Notes (Signed)
Procedure Name: LMA Insertion Date/Time: 09/27/2022 12:32 PM  Performed by: Georgeanne Nim, CRNAPre-anesthesia Checklist: Patient identified, Emergency Drugs available, Suction available, Patient being monitored and Timeout performed Patient Re-evaluated:Patient Re-evaluated prior to induction Oxygen Delivery Method: Circle system utilized Preoxygenation: Pre-oxygenation with 100% oxygen Induction Type: IV induction Ventilation: Mask ventilation without difficulty LMA: LMA inserted LMA Size: 5.0 Number of attempts: 1 Placement Confirmation: positive ETCO2 and breath sounds checked- equal and bilateral Tube secured with: Tape Dental Injury: Teeth and Oropharynx as per pre-operative assessment

## 2022-09-27 NOTE — Interval H&P Note (Signed)
History and Physical Interval Note:  09/27/2022 12:20 PM  Kevin Buck  has presented today for surgery, with the diagnosis of BENIGN PROSTATIC HYPERPLASIA.  The various methods of treatment have been discussed with the patient and family. After consideration of risks, benefits and other options for treatment, the patient has consented to  Procedure(s) with comments: [place] (TURP) (N/A) - 90 MINS as a surgical intervention.  The patient's history has been reviewed, patient examined, no change in status, stable for surgery.  I have reviewed the patient's chart and labs.  Questions were answered to the patient's satisfaction.     Bernadean Saling D Aleksey Newbern

## 2022-09-28 DIAGNOSIS — C679 Malignant neoplasm of bladder, unspecified: Secondary | ICD-10-CM | POA: Diagnosis not present

## 2022-09-28 DIAGNOSIS — R350 Frequency of micturition: Secondary | ICD-10-CM | POA: Diagnosis not present

## 2022-09-28 DIAGNOSIS — N138 Other obstructive and reflux uropathy: Secondary | ICD-10-CM | POA: Diagnosis not present

## 2022-09-28 DIAGNOSIS — N320 Bladder-neck obstruction: Secondary | ICD-10-CM | POA: Diagnosis not present

## 2022-09-28 DIAGNOSIS — C61 Malignant neoplasm of prostate: Secondary | ICD-10-CM | POA: Diagnosis not present

## 2022-09-28 DIAGNOSIS — N401 Enlarged prostate with lower urinary tract symptoms: Secondary | ICD-10-CM | POA: Diagnosis not present

## 2022-09-28 DIAGNOSIS — Z8042 Family history of malignant neoplasm of prostate: Secondary | ICD-10-CM | POA: Diagnosis not present

## 2022-09-28 MED ORDER — OXYCODONE HCL 5 MG PO TABS: 5.0000 mg | ORAL_TABLET | Freq: Three times a day (TID) | ORAL | 0 refills | Status: AC | PRN

## 2022-09-28 MED ORDER — TAMSULOSIN HCL 0.4 MG PO CAPS: 0.4000 mg | ORAL_CAPSULE | Freq: Every day | ORAL | 0 refills | Status: DC

## 2022-09-28 MED ORDER — ACETAMINOPHEN 325 MG PO TABS
ORAL_TABLET | ORAL | Status: AC
  Filled 2022-09-28: qty 2

## 2022-09-28 MED ORDER — OXYCODONE HCL 5 MG PO TABS
ORAL_TABLET | ORAL | Status: AC
  Filled 2022-09-28: qty 1

## 2022-09-28 NOTE — Discharge Summary (Signed)
Date of admission: 09/27/2022  Date of discharge: 09/28/2022  Admission diagnosis: BPH with BOO, bladder cancer  Discharge diagnosis: same  Secondary diagnoses:  Patient Active Problem List   Diagnosis Date Noted   BPH with obstruction/lower urinary tract symptoms 09/27/2022   Bladder tumor 06/11/2020    Procedures performed: Procedure(s): TRANSURETHRAL RESECTION OF THE PROSTATE (TURP) CYSTOSCOPY WITH FULGERATION  History and Physical: For full details, please see admission history and physical. Briefly, Kevin Buck is a 66 y.o. year old patient with history of bladder cancer and BPH with BOO.   NAD, alert and oriented Nonlabored respirations GU 22Fr foley draining light tea colored urine, CBI very slow drip  Hospital Course: Patient tolerated the procedure well.  He was then transferred to the floor after an uneventful PACU stay.  His hospital course was uncomplicated.  On POD#1 he had met discharge criteria: was eating a regular diet, was up and ambulating independently,  pain was well controlled, CBI was weaned with light tea colored urine, and was ready to for discharge.   Laboratory values:  No results for input(s): "WBC", "HGB", "HCT" in the last 72 hours. No results for input(s): "NA", "K", "CL", "CO2", "GLUCOSE", "BUN", "CREATININE", "CALCIUM" in the last 72 hours. No results for input(s): "LABPT", "INR" in the last 72 hours. No results for input(s): "LABURIN" in the last 72 hours. Results for orders placed or performed in visit on 05/18/21  SARS Coronavirus 2 (TAT 6-24 hrs)     Status: Abnormal   Collection Time: 05/18/21 12:00 AM  Result Value Ref Range Status   SARS Coronavirus 2 RESULT: POSITIVE (A)  Corrected    Comment: RESULT: POSITIVESARS-CoV-2 INTERPRETATION:A POSITIVE  test result means that SARS-CoV-2 RNA was present in the specimen above the limit of detection of this test. The presence of SARS-CoV-2 RNA is indicative of infection with SARS-CoV-2.  Detection of  SARS-CoV-2 RNA may not rule-out bacterial infection or co-infection with other viruses. Positive and negative predictive values of testing are highly dependent on prevalence. False positive test results are more likely when prevalence of disease is  low.The expected result is NEGATIVE.Fact Sheet for Healthcare Providers: SisterViews.hu Sheet for Patients: SwimConditioning.hu Reference Range - Negative     Disposition: Home  Discharge instruction: The patient was instructed to be ambulatory but told to refrain from heavy lifting, strenuous activity, or driving.   Discharge medications:  Allergies as of 09/28/2022   No Known Allergies      Medication List     TAKE these medications    acetaminophen 325 MG tablet Commonly known as: Tylenol Take 2 tablets (650 mg total) by mouth every 6 (six) hours as needed for moderate pain.   cyclobenzaprine 5 MG tablet Commonly known as: FLEXERIL Take 1 tablet (5 mg total) by mouth 3 (three) times daily as needed for muscle spasms.   multivitamin tablet Take 1 tablet by mouth daily.   oxyCODONE 5 MG immediate release tablet Commonly known as: Roxicodone Take 1 tablet (5 mg total) by mouth every 8 (eight) hours as needed.   Prostate Health Caps Take 1 capsule by mouth daily. Prostate plus health complex   tamsulosin 0.4 MG Caps capsule Commonly known as: FLOMAX Take 1 capsule (0.4 mg total) by mouth at bedtime.        Followup:   Follow-up Information     [facility] Follow up on 09/29/2022.   Why: 9:00am Contact information: [place]  [phone]

## 2022-09-29 ENCOUNTER — Encounter (HOSPITAL_BASED_OUTPATIENT_CLINIC_OR_DEPARTMENT_OTHER): Payer: Self-pay | Admitting: Urology

## 2022-09-30 LAB — SURGICAL PATHOLOGY

## 2022-11-25 DIAGNOSIS — R972 Elevated prostate specific antigen [PSA]: Secondary | ICD-10-CM | POA: Diagnosis not present

## 2022-11-26 DIAGNOSIS — H6121 Impacted cerumen, right ear: Secondary | ICD-10-CM | POA: Diagnosis not present

## 2022-12-09 DIAGNOSIS — H6121 Impacted cerumen, right ear: Secondary | ICD-10-CM | POA: Diagnosis not present

## 2022-12-28 DIAGNOSIS — N5201 Erectile dysfunction due to arterial insufficiency: Secondary | ICD-10-CM | POA: Diagnosis not present

## 2022-12-28 DIAGNOSIS — N401 Enlarged prostate with lower urinary tract symptoms: Secondary | ICD-10-CM | POA: Diagnosis not present

## 2022-12-28 DIAGNOSIS — R351 Nocturia: Secondary | ICD-10-CM | POA: Diagnosis not present

## 2022-12-28 DIAGNOSIS — C61 Malignant neoplasm of prostate: Secondary | ICD-10-CM | POA: Diagnosis not present

## 2022-12-28 DIAGNOSIS — C678 Malignant neoplasm of overlapping sites of bladder: Secondary | ICD-10-CM | POA: Diagnosis not present

## 2023-01-19 ENCOUNTER — Other Ambulatory Visit: Payer: Self-pay | Admitting: Urology

## 2023-01-19 DIAGNOSIS — C61 Malignant neoplasm of prostate: Secondary | ICD-10-CM

## 2023-02-12 ENCOUNTER — Ambulatory Visit
Admission: RE | Admit: 2023-02-12 | Discharge: 2023-02-12 | Disposition: A | Discharge status: Home / Self Care | Payer: PPO | Source: Ambulatory Visit | Attending: Urology | Admitting: Urology

## 2023-02-12 DIAGNOSIS — Z8551 Personal history of malignant neoplasm of bladder: Secondary | ICD-10-CM | POA: Diagnosis not present

## 2023-02-12 DIAGNOSIS — C61 Malignant neoplasm of prostate: Secondary | ICD-10-CM

## 2023-02-12 DIAGNOSIS — D291 Benign neoplasm of prostate: Secondary | ICD-10-CM | POA: Diagnosis not present

## 2023-02-12 MED ORDER — GADOPICLENOL 0.5 MMOL/ML IV SOLN
10.0000 mL | Freq: Once | INTRAVENOUS | Status: AC | PRN
  Administered 2023-02-12: 10 mL via INTRAVENOUS

## 2023-03-29 DIAGNOSIS — C678 Malignant neoplasm of overlapping sites of bladder: Secondary | ICD-10-CM | POA: Diagnosis not present

## 2023-03-29 DIAGNOSIS — Z5111 Encounter for antineoplastic chemotherapy: Secondary | ICD-10-CM | POA: Diagnosis not present

## 2023-06-28 DIAGNOSIS — C61 Malignant neoplasm of prostate: Secondary | ICD-10-CM | POA: Diagnosis not present

## 2023-07-05 DIAGNOSIS — N401 Enlarged prostate with lower urinary tract symptoms: Secondary | ICD-10-CM | POA: Diagnosis not present

## 2023-07-05 DIAGNOSIS — C61 Malignant neoplasm of prostate: Secondary | ICD-10-CM | POA: Diagnosis not present

## 2023-07-05 DIAGNOSIS — C678 Malignant neoplasm of overlapping sites of bladder: Secondary | ICD-10-CM | POA: Diagnosis not present

## 2023-07-05 DIAGNOSIS — R350 Frequency of micturition: Secondary | ICD-10-CM | POA: Diagnosis not present

## 2023-10-19 ENCOUNTER — Other Ambulatory Visit: Payer: Self-pay | Admitting: Urology

## 2023-12-05 NOTE — Progress Notes (Signed)
 The patient was identified using 2 approved identifiers. All issues noted in this document were discussed and addressed, Kevin Buck voiced understanding and agreement with all preoperative instructions. The patient was emailed the surgery instructions per his request.    [email or web address]  The patient was instructed to call our  Admitting Office [phone] or [phone]) to complete their Pre-surgical Interview.    Medication Reconciliation was completed on 11-30-23.  COVID Vaccine received:  []  No [x]  Yes Date of any COVID positive Test in last 90 days:  None  PCP - none Cardiologist -  none  Chest x-ray - 07-31-2022  2v  Epic EKG -  07-31-2022  Epic Stress Test -  ECHO -  Cardiac Cath -   PCR screen: []  Ordered & Completed []   No Order but Needs PROFEND     [x]   N/A for this surgery  Surgery Plan:  [x]  Ambulatory   []  Outpatient in bed  []  Admit Anesthesia:    [x]  General  []  Spinal  []   Choice []   MAC  Bowel Prep - [x]  No  []   Yes ______  Pacemaker / ICD device [x]  No []  Yes   Spinal Cord Stimulator:[x]  No []  Yes       History of Sleep Apnea? [x]  No []  Yes   CPAP used?- [x]  No []  Yes    Does the patient monitor blood sugar?   [x]  N/A   []  No []  Yes  Patient has: [x]  NO Hx DM   []  Pre-DM   []  DM1  []   DM2  Blood Thinner / Instructions: none Aspirin Instructions:  none  ERAS Protocol Ordered: [x]  No  []  Yes Patient is to be NPO after: Midnight prior  Dental hx: []  Dentures:  [x]  N/A      []  Bridge or Partial:                   []  Loose or Damaged teeth:   Activity level: Patient is able to climb a flight of stairs without difficulty; [x]  No CP  [x]  No SOB.  Patient can perform ADLs without assistance.   Anesthesia review: GERD, no pertinent history.   Patient denies shortness of breath, fever, cough and chest pain at PAT appointment.  Patient verbalized understanding and agreement to the Pre-Surgical Instructions that were given to them at this PAT  appointment. Patient was also educated of the need to review these PAT instructions again prior to his/her surgery.I reviewed the appropriate phone numbers to call if they have any and questions or concerns.

## 2023-12-05 NOTE — Patient Instructions (Signed)
 _______________________________________________________________________  SURGICAL WAITING ROOM VISITATION Patients having surgery or a procedure may have no more than 2 support people in the waiting area - these visitors may rotate in the visitor waiting room.   Due to an increase in RSV and influenza rates and associated hospitalizations, children ages 25 and under may not visit patients in [facility] hospitals. If the patient needs to stay at the hospital during part of their recovery, the visitor guidelines for inpatient rooms apply.  PRE-OP VISITATION  Pre-op nurse will coordinate an appropriate time for 1 support person to accompany the patient in pre-op.  This support person may not rotate.  This visitor will be contacted when the time is appropriate for the visitor to come back in the pre-op area.  Please refer to the [facility] website for the visitor guidelines for Inpatients (after your surgery is over and you are in a regular room).  You are not required to quarantine at this time prior to your surgery. However, you must do this: Hand Hygiene often Do NOT share personal items Notify your provider if you are in close contact with someone who has COVID or you develop fever 100.4 or greater, new onset of sneezing, cough, sore throat, shortness of breath or body aches.  If you test positive for Covid or have been in contact with anyone that has tested positive in the last 10 days please notify you surgeon.    Your procedure is scheduled on:  TUESDAY  December 19, 2023  Report to [facility] Main Entrance: [address] entrance where the [employer] is available.   Report to admitting at: 07:15 AM  Call this number if you have any questions or problems the morning of surgery [phone]  DO NOT EAT OR DRINK ANYTHING AFTER MIDNIGHT THE NIGHT PRIOR TO YOUR SURGERY / PROCEDURE.   FOLLOW  ANY ADDITIONAL PRE OP INSTRUCTIONS YOU RECEIVED FROM YOUR SURGEON'S OFFICE!!!   Oral  Hygiene is also important to reduce your risk of infection.        Remember - BRUSH YOUR TEETH THE MORNING OF SURGERY WITH YOUR REGULAR TOOTHPASTE  Do NOT smoke after Midnight the night before surgery.  STOP TAKING all Vitamins, Herbs and supplements 1 week before your surgery.   Take ONLY these medicines the morning of surgery with A SIP OF WATER: None                   You may not have any metal on your body including  jewelry, and body piercing  Do not wear  lotions, powders, cologne, or deodorant  Men may shave face and neck.  Contacts, Hearing Aids, dentures or bridgework may not be worn into surgery. DENTURES WILL BE REMOVED PRIOR TO SURGERY PLEASE DO NOT APPLY "Poly grip" OR ADHESIVES!!!  Patients discharged on the day of surgery will not be allowed to drive home.  Someone NEEDS to stay with you for the first 24 hours after anesthesia.  Do not bring your home medications to the hospital. The Pharmacy will dispense medications listed on your medication list to you during your admission in the Hospital.  Please read over the following fact sheets you were given: IF YOU HAVE QUESTIONS ABOUT YOUR PRE-OP INSTRUCTIONS, PLEASE CALL [phone]    Rudean Haskell, RN    [facility] - Preparing for Surgery Before surgery, you can play an important role.  Because skin is not sterile, your skin needs to be as free of germs as  possible.  You can reduce the number of germs on your skin by washing with Antibacterial soap before surgery.  . Do not shave (including legs and underarms) for at least 48 hours prior to the first shower.  You may shave your face/neck.  Please follow these instructions carefully:  1.  Shower with antibacterial Soap the night before surgery and the  morning of surgery.  2.  If you choose to wash your hair, wash your hair first as usual with your normal  shampoo.  3.  After you shampoo, rinse your hair and body thoroughly to remove the shampoo.                              4.  You can apply soap directly to the skin and wash.  Gently with a scrungie or clean washcloth.  5.  Wash face,  Genitals (private parts) with your normal soap.             6.  Wash thoroughly, paying special attention to the area where your  surgery  will be performed.  7.  Thoroughly rinse your body with warm water from the neck down.  8.   Pat yourself dry with a clean towel.             9  Wear clean pajamas.            10 Place clean sheets on your bed the night of your first shower and do not  sleep with pets.  ON THE DAY OF SURGERY : Do not apply any lotions/deodorants the morning of surgery.  Please wear clean clothes to the hospital/surgery center.

## 2023-12-06 ENCOUNTER — Encounter (HOSPITAL_COMMUNITY): Payer: Self-pay

## 2023-12-06 ENCOUNTER — Other Ambulatory Visit: Payer: Self-pay

## 2023-12-06 ENCOUNTER — Encounter (HOSPITAL_COMMUNITY)
Admission: RE | Admit: 2023-12-06 | Discharge: 2023-12-06 | Disposition: A | Discharge status: Home / Self Care | Payer: PPO | Source: Ambulatory Visit | Attending: Urology | Admitting: Urology

## 2023-12-06 DIAGNOSIS — D494 Neoplasm of unspecified behavior of bladder: Secondary | ICD-10-CM

## 2023-12-06 DIAGNOSIS — N138 Other obstructive and reflux uropathy: Secondary | ICD-10-CM

## 2023-12-06 DIAGNOSIS — Z01818 Encounter for other preprocedural examination: Secondary | ICD-10-CM

## 2023-12-06 HISTORY — DX: Gastro-esophageal reflux disease without esophagitis: K21.9

## 2023-12-19 ENCOUNTER — Ambulatory Visit (HOSPITAL_COMMUNITY)
Admission: RE | Admit: 2023-12-19 | Discharge: 2023-12-19 | Disposition: A | Discharge status: Home / Self Care | Payer: PPO | Source: Ambulatory Visit | Attending: Urology | Admitting: Urology

## 2023-12-19 ENCOUNTER — Encounter (HOSPITAL_COMMUNITY): Payer: Self-pay | Admitting: Urology

## 2023-12-19 ENCOUNTER — Ambulatory Visit (HOSPITAL_BASED_OUTPATIENT_CLINIC_OR_DEPARTMENT_OTHER): Admitting: Anesthesiology

## 2023-12-19 ENCOUNTER — Other Ambulatory Visit: Payer: Self-pay

## 2023-12-19 ENCOUNTER — Ambulatory Visit (HOSPITAL_COMMUNITY): Admitting: Anesthesiology

## 2023-12-19 ENCOUNTER — Encounter (HOSPITAL_COMMUNITY)
Admission: RE | Disposition: A | Discharge status: Home / Self Care | Payer: Self-pay | Source: Ambulatory Visit | Attending: Urology

## 2023-12-19 ENCOUNTER — Ambulatory Visit (HOSPITAL_COMMUNITY)

## 2023-12-19 DIAGNOSIS — Z8546 Personal history of malignant neoplasm of prostate: Secondary | ICD-10-CM | POA: Diagnosis not present

## 2023-12-19 DIAGNOSIS — N320 Bladder-neck obstruction: Secondary | ICD-10-CM

## 2023-12-19 DIAGNOSIS — Z8551 Personal history of malignant neoplasm of bladder: Secondary | ICD-10-CM | POA: Diagnosis not present

## 2023-12-19 DIAGNOSIS — K219 Gastro-esophageal reflux disease without esophagitis: Secondary | ICD-10-CM | POA: Diagnosis not present

## 2023-12-19 HISTORY — PX: CYSTOSCOPY WITH URETHRAL DILATATION: SHX5125

## 2023-12-19 SURGERY — CYSTOSCOPY, WITH URETHRAL DILATION
Anesthesia: General

## 2023-12-19 MED ORDER — FENTANYL CITRATE (PF) 100 MCG/2ML IJ SOLN
INTRAMUSCULAR | Status: AC
Start: 2023-12-19 — End: ?
  Filled 2023-12-19: qty 2

## 2023-12-19 MED ORDER — DEXAMETHASONE SODIUM PHOSPHATE 10 MG/ML IJ SOLN
INTRAMUSCULAR | Status: AC
  Filled 2023-12-19: qty 1

## 2023-12-19 MED ORDER — ACETAMINOPHEN 10 MG/ML IV SOLN: 1000.0000 mg | Freq: Once | INTRAVENOUS | Status: DC | PRN

## 2023-12-19 MED ORDER — LIDOCAINE HCL (CARDIAC) PF 100 MG/5ML IV SOSY
PREFILLED_SYRINGE | INTRAVENOUS | Status: DC | PRN
Start: 2023-12-19 — End: 2023-12-19
  Administered 2023-12-19: 100 mg via INTRAVENOUS

## 2023-12-19 MED ORDER — ONDANSETRON HCL 4 MG/2ML IJ SOLN
INTRAMUSCULAR | Status: DC | PRN
  Administered 2023-12-19: 4 mg via INTRAVENOUS

## 2023-12-19 MED ORDER — CEFAZOLIN SODIUM-DEXTROSE 2-4 GM/100ML-% IV SOLN
2.0000 g | INTRAVENOUS | Status: AC
  Administered 2023-12-19: 2 g via INTRAVENOUS
  Filled 2023-12-19: qty 100

## 2023-12-19 MED ORDER — HYOSCYAMINE SULFATE 0.125 MG PO TBDP: 0.1250 mg | ORAL_TABLET | Freq: Four times a day (QID) | ORAL | 0 refills | Status: AC | PRN

## 2023-12-19 MED ORDER — OXYCODONE HCL 5 MG PO TABS: 5.0000 mg | ORAL_TABLET | Freq: Once | ORAL | Status: DC | PRN

## 2023-12-19 MED ORDER — CHLORHEXIDINE GLUCONATE 0.12 % MT SOLN
15.0000 mL | Freq: Once | OROMUCOSAL | Status: AC
  Administered 2023-12-19: 15 mL via OROMUCOSAL

## 2023-12-19 MED ORDER — ACETAMINOPHEN 325 MG PO TABS: 325.0000 mg | ORAL_TABLET | ORAL | Status: DC | PRN

## 2023-12-19 MED ORDER — ONDANSETRON HCL 4 MG/2ML IJ SOLN
INTRAMUSCULAR | Status: AC
  Filled 2023-12-19: qty 2

## 2023-12-19 MED ORDER — DEXAMETHASONE SODIUM PHOSPHATE 10 MG/ML IJ SOLN
INTRAMUSCULAR | Status: DC | PRN
  Administered 2023-12-19: 10 mg via INTRAVENOUS

## 2023-12-19 MED ORDER — ORAL CARE MOUTH RINSE: 15.0000 mL | Freq: Once | OROMUCOSAL | Status: AC

## 2023-12-19 MED ORDER — FENTANYL CITRATE PF 50 MCG/ML IJ SOSY: 25.0000 ug | PREFILLED_SYRINGE | INTRAMUSCULAR | Status: DC | PRN

## 2023-12-19 MED ORDER — FENTANYL CITRATE (PF) 100 MCG/2ML IJ SOLN
INTRAMUSCULAR | Status: DC | PRN
  Administered 2023-12-19: 25 ug via INTRAVENOUS
  Administered 2023-12-19: 50 ug via INTRAVENOUS
  Administered 2023-12-19: 25 ug via INTRAVENOUS

## 2023-12-19 MED ORDER — LACTATED RINGERS IV SOLN: INTRAVENOUS | Status: DC

## 2023-12-19 MED ORDER — LIDOCAINE HCL (PF) 2 % IJ SOLN
INTRAMUSCULAR | Status: AC
  Filled 2023-12-19: qty 5

## 2023-12-19 MED ORDER — SODIUM CHLORIDE 0.9 % IR SOLN
Status: DC | PRN
  Administered 2023-12-19: 3000 mL

## 2023-12-19 MED ORDER — STERILE WATER FOR IRRIGATION IR SOLN
Status: DC | PRN
  Administered 2023-12-19: 20 mL

## 2023-12-19 MED ORDER — OXYCODONE HCL 5 MG/5ML PO SOLN: 5.0000 mg | Freq: Once | ORAL | Status: DC | PRN

## 2023-12-19 MED ORDER — MIDAZOLAM HCL 2 MG/2ML IJ SOLN
INTRAMUSCULAR | Status: AC
  Filled 2023-12-19: qty 2

## 2023-12-19 MED ORDER — DROPERIDOL 2.5 MG/ML IJ SOLN: 0.6250 mg | Freq: Once | INTRAMUSCULAR | Status: DC | PRN

## 2023-12-19 MED ORDER — PROPOFOL 10 MG/ML IV BOLUS
INTRAVENOUS | Status: AC
  Filled 2023-12-19: qty 20

## 2023-12-19 MED ORDER — ACETAMINOPHEN 160 MG/5ML PO SOLN: 325.0000 mg | ORAL | Status: DC | PRN

## 2023-12-19 MED ORDER — IOHEXOL 300 MG/ML  SOLN
INTRAMUSCULAR | Status: DC | PRN
  Administered 2023-12-19: 20 mL

## 2023-12-19 MED ORDER — MIDAZOLAM HCL 5 MG/5ML IJ SOLN
INTRAMUSCULAR | Status: DC | PRN
  Administered 2023-12-19: 2 mg via INTRAVENOUS

## 2023-12-19 MED ORDER — TRAMADOL HCL 50 MG PO TABS: 50.0000 mg | ORAL_TABLET | Freq: Four times a day (QID) | ORAL | 0 refills | Status: AC | PRN

## 2023-12-19 MED ORDER — PROPOFOL 10 MG/ML IV BOLUS
INTRAVENOUS | Status: DC | PRN
  Administered 2023-12-19: 170 mg via INTRAVENOUS

## 2023-12-19 SURGICAL SUPPLY — 29 items
BAG URINE DRAIN 2000ML AR STRL (UROLOGICAL SUPPLIES) ×1 IMPLANT
BAG URO CATCHER STRL LF (MISCELLANEOUS) ×1 IMPLANT
BALLN NEPHROSTOMY (BALLOONS) IMPLANT
BALLN OPTILUME DCB 30X5X75 (BALLOONS) ×1 IMPLANT
BALLOON NEPHROSTOMY (BALLOONS) IMPLANT
BALLOON OPTILUME DCB 30X5X75 (BALLOONS) IMPLANT
CATH FOLEY 2W COUNCIL 20FR 5CC (CATHETERS) IMPLANT
CATH FOLEY 2W COUNCIL 5CC 16FR (CATHETERS) IMPLANT
CATH ROBINSON RED A/P 14FR (CATHETERS) IMPLANT
CATH URET 5FR 70CM CONE TIP (BALLOONS) IMPLANT
CATH URETL OPEN END 6FR 70 (CATHETERS) IMPLANT
CLOTH BEACON ORANGE TIMEOUT ST (SAFETY) ×1 IMPLANT
DRAPE FOOT SWITCH (DRAPES) ×1 IMPLANT
ELECT REM PT RETURN 15FT ADLT (MISCELLANEOUS) IMPLANT
GLOVE BIO SURGEON STRL SZ 6.5 (GLOVE) ×1 IMPLANT
GOWN STRL REUS W/ TWL LRG LVL3 (GOWN DISPOSABLE) ×1 IMPLANT
GUIDEWIRE ANG ZIPWIRE 038X150 (WIRE) IMPLANT
GUIDEWIRE STR DUAL SENSOR (WIRE) IMPLANT
KIT TURNOVER KIT A (KITS) IMPLANT
LOOP CUT BIPOLAR 24F LRG (ELECTROSURGICAL) IMPLANT
MANIFOLD NEPTUNE II (INSTRUMENTS) ×1 IMPLANT
NS IRRIG 1000ML POUR BTL (IV SOLUTION) IMPLANT
PACK CYSTO (CUSTOM PROCEDURE TRAY) ×1 IMPLANT
PAD PREP 24X48 CUFFED NSTRL (MISCELLANEOUS) ×1 IMPLANT
SYR TOOMEY IRRIG 70ML (MISCELLANEOUS) IMPLANT
SYRINGE TOOMEY IRRIG 70ML (MISCELLANEOUS) IMPLANT
TUBING CONNECTING 10 (TUBING) ×1 IMPLANT
TUBING UROLOGY SET (TUBING) ×1 IMPLANT
WATER STERILE IRR 3000ML UROMA (IV SOLUTION) ×1 IMPLANT

## 2023-12-19 NOTE — Transfer of Care (Signed)
 Immediate Anesthesia Transfer of Care Note  Patient: Kevin Buck  Procedure(s) Performed: DIAGNOSTIC CYSTOSCOPY,BLADDER NECK INCISION, OPTILUME  URETHRAL DILATATION  Patient Location: PACU  Anesthesia Type:General  Level of Consciousness: drowsy  Airway & Oxygen Therapy: Patient Spontanous Breathing and Patient connected to face mask oxygen  Post-op Assessment: Report given to RN and Post -op Vital signs reviewed and stable  Post vital signs: Reviewed and stable  Last Vitals:  Vitals Value Taken Time  BP 128/95 12/19/23 0958  Temp    Pulse 93 12/19/23 0959  Resp 17 12/19/23 0959  SpO2 98 % 12/19/23 0959  Vitals shown include unfiled device data.  Last Pain:  Vitals:   12/19/23 0732  TempSrc: Oral         Complications: No notable events documented.

## 2023-12-19 NOTE — Anesthesia Preprocedure Evaluation (Addendum)
 Anesthesia Evaluation  Patient identified by MRN, date of birth, ID band Patient awake    Reviewed: Allergy & Precautions, NPO status , Patient's Chart, lab work & pertinent test results  Airway Mallampati: II  TM Distance: <3 FB Neck ROM: Full    Dental  (+) Teeth Intact, Dental Advisory Given   Pulmonary neg pulmonary ROS   breath sounds clear to auscultation       Cardiovascular + dysrhythmias  Rhythm:Regular Rate:Normal     Neuro/Psych negative neurological ROS  negative psych ROS   GI/Hepatic Neg liver ROS,GERD  ,,  Endo/Other  negative endocrine ROS    Renal/GU negative Renal ROS     Musculoskeletal negative musculoskeletal ROS (+)    Abdominal   Peds  Hematology negative hematology ROS (+)   Anesthesia Other Findings   Reproductive/Obstetrics                             Anesthesia Physical Anesthesia Plan  ASA: 2  Anesthesia Plan: General   Post-op Pain Management: Tylenol PO (pre-op)* and Toradol IV (intra-op)*   Induction: Intravenous  PONV Risk Score and Plan: 3 and Ondansetron, Dexamethasone and Midazolam  Airway Management Planned: LMA  Additional Equipment: None  Intra-op Plan:   Post-operative Plan: Extubation in OR  Informed Consent: I have reviewed the patients History and Physical, chart, labs and discussed the procedure including the risks, benefits and alternatives for the proposed anesthesia with the patient or authorized representative who has indicated his/her understanding and acceptance.       Plan Discussed with: CRNA  Anesthesia Plan Comments:        Anesthesia Quick Evaluation

## 2023-12-19 NOTE — Discharge Instructions (Addendum)
 Post Bladder Surgery Instructions   General instructions:     Your recent bladder surgery requires very little post hospital care but some definite precautions.  Despite the fact that no skin incisions were used, the area around the bladder incisions are raw and covered with scabs to promote healing and prevent bleeding. Certain precautions are needed to insure that the scabs are not disturbed over the next 2-4 weeks while the healing proceeds.  Because the raw surface inside your bladder and the irritating effects of urine you may expect frequency of urination and/or urgency (a stronger desire to urinate) and perhaps even getting up at night more often. This will usually resolve or improve slowly over the healing period. You may see some blood in your urine over the first 6 weeks. Do not be alarmed, even if the urine was clear for a while. Get off your feet and drink lots of fluids until clearing occurs. If you start to pass clots or don't improve call us.  Catheter: (If you are discharged with a catheter.)  1. Keep your catheter secured to your leg at all times with tape or the supplied strap. 2. You may experience leakage of urine around your catheter- as long as the  catheter continues to drain, this is normal.  If your catheter stops draining  go to the ER. 3. You may also have blood in your urine, even after it has been clear for  several days; you may even pass some small blood clots or other material.  This  is normal as well.  If this happens, sit down and drink plenty of water to help  make urine to flush out your bladder.  If the blood in your urine becomes worse  after doing this, contact our office or return to the ER. 4. You may use the leg bag (small bag) during the day, but use the large bag at  night. 5.  Please use a condom for the next 30 days following urethral dilation.  Diet:  You may return to your normal diet immediately. Because of the raw surface of your bladder,  alcohol, spicy foods, foods high in acid and drinks with caffeine may cause irritation or frequency and should be used in moderation. To keep your urine flowing freely and avoid constipation, drink plenty of fluids during the day (8-10 glasses). Tip: Avoid cranberry juice because it is very acidic.  Activity:  Your physical activity doesn't need to be restricted. However, if you are very active, you may see some blood in the urine. We suggest that you reduce your activity under the circumstances until the bleeding has stopped.  Bowels:  It is important to keep your bowels regular during the postoperative period. Straining with bowel movements can cause bleeding. A bowel movement every other day is reasonable. Use a mild laxative if needed, such as milk of magnesia 2-3 tablespoons, or 2 Dulcolax tablets. Call if you continue to have problems. If you had been taking narcotics for pain, before, during or after your surgery, you may be constipated. Take a laxative if necessary.    Medication:  You should resume your pre-surgery medications unless told not to. In addition you may be given an antibiotic to prevent or treat infection. Antibiotics are not always necessary. All medication should be taken as prescribed until the bottles are finished unless you are having an unusual reaction to one of the drugs.

## 2023-12-19 NOTE — Anesthesia Procedure Notes (Signed)
 Procedure Name: LMA Insertion Date/Time: 12/19/2023 9:14 AM  Performed by: Maurene Capes, CRNAPre-anesthesia Checklist: Patient identified, Emergency Drugs available, Suction available and Patient being monitored Patient Re-evaluated:Patient Re-evaluated prior to induction Oxygen Delivery Method: Circle System Utilized Preoxygenation: Pre-oxygenation with 100% oxygen Induction Type: IV induction Ventilation: Mask ventilation without difficulty LMA: LMA inserted LMA Size: 4.0 Number of attempts: 1 Airway Equipment and Method: Bite block Placement Confirmation: positive ETCO2 Tube secured with: Tape Dental Injury: Teeth and Oropharynx as per pre-operative assessment

## 2023-12-19 NOTE — Interval H&P Note (Signed)
 History and Physical Interval Note:  12/19/2023 8:38 AM  Kevin Buck  has presented today for surgery, with the diagnosis of BLADDER CANCER AND BLADDER NECK CONTRACTURE.  The various methods of treatment have been discussed with the patient and family. After consideration of risks, benefits and other options for treatment, the patient has consented to  Procedure(s): CYSTOSCOPY WITH OPTILUME  URETHRAL DILATATION (N/A) POSSIBLE TRANSURETHRAL RESECTION OF BLADDER TUMOR (TURBT) (N/A) as a surgical intervention.  The patient's history has been reviewed, patient examined, no change in status, stable for surgery.  I have reviewed the patient's chart and labs.  Questions were answered to the patient's satisfaction.     Dezmond Downie D Ione [place]

## 2023-12-19 NOTE — Op Note (Signed)
 Operative Note  Preoperative diagnosis:  1.  History of bladder cancer 2.  Bladder neck contracture.  Postoperative diagnosis: 1.  History of bladder cancer 2.  Bladder neck contracture.  Procedure(s): 1.  Diagnostic cystoscopy 2.  Bladder neck incision 3.  Optilume urethral dilation  Surgeon: Kasandra Knudsen, MD  Assistants:  None  Anesthesia:  General  Complications:  None  EBL: None  Specimens: 1.  None  Drains/Catheters: 1.  16 French council tip Foley  Intraoperative findings:   Normal anterior urethra Prostatic urethra with evidence of prior TUR Bladder neck contracture Orthotopic right ureteral orifice.  Left ureteral orifice displaced laterally due to prior TUR. Well-healed prior TUR scars.  No evidence of bladder cancer recurrence.  Indication:  Kevin Buck is a 68 y.o. male with history of bladder cancer as well as BPH with LUTS who underwent TURP.  During surveillance cystoscopy in the office patient was noted to have a bladder neck contracture.  Description of procedure:  After risks and benefits of the procedure discussed with the patient, informed consent was obtained.  The patient is taken the operating room placed in supine position.  Anesthesia was induced and antibiotics were administered.  He was then repositioned in the dorsolithotomy position.  Patient was prepped and draped in the usual sterile fashion and timeout was performed.  A 21 French rigid cystoscope was placed in the urethral meatus and advanced to the bladder neck under direct visualization.  A bladder neck contracture was seen and with gentle pressure the scope was advanced into the bladder.  Systemic cystoscopy then took place.  Findings are noted above.  No bladder cancer recurrence was noted.  The cystoscope was removed.  The resectoscope was assembled with the visual obturator.  It was advanced into the bladder under direct visualization.  The visual obturator was removed and  the bipolar working element with the hook was assembled.  The bladder neck contracture was incised at 11 o'clock position.  Care was taken to avoid the ureteral orifices.  The resectoscope was removed.  The cystoscope was again replaced and a 0.38 sensor wire was advanced into the bladder.  Next the Optilume urethral balloon dilator was advanced over the wires of the radiopaque markers band the bladder neck.  It was inflated to 8 mmHg and held in place for 3 minutes.  It was then deflated and 16 [place] council tip Foley catheter was advanced over the wire and into the bladder.  It was inflated with 20 cc sterile water.  The patient emerged from anesthesia and sent to the PACU in stable condition.  Plan: Void trial in 3 days

## 2023-12-19 NOTE — Anesthesia Postprocedure Evaluation (Signed)
 Anesthesia Post Note  Patient: TRAVONTAE FREIBERGER  Procedure(s) Performed: DIAGNOSTIC CYSTOSCOPY,BLADDER NECK INCISION, OPTILUME  URETHRAL DILATATION     Patient location during evaluation: PACU Anesthesia Type: General Level of consciousness: awake and alert Pain management: pain level controlled Vital Signs Assessment: post-procedure vital signs reviewed and stable Respiratory status: spontaneous breathing, nonlabored ventilation, respiratory function stable and patient connected to nasal cannula oxygen Cardiovascular status: blood pressure returned to baseline and stable Postop Assessment: no apparent nausea or vomiting Anesthetic complications: no  No notable events documented.  Last Vitals:  Vitals:   12/19/23 1030 12/19/23 1045  BP: (!) 122/90 122/85  Pulse: 81 79  Resp: 14 11  Temp: 36.5 C 36.6 C  SpO2: 92% 92%    Last Pain:  Vitals:   12/19/23 1045  TempSrc:   PainSc: 0-No pain                 Shelton Silvas

## 2023-12-19 NOTE — H&P (Signed)
 cc: bladder cancer    09/20/21: 68 year old man with a history of high-grade T1 bladder cancer diagnosed in August 2021 who completed induction BCG and had recurrence on for surveillance cystoscopy. 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 He completed second induction BCG and most recently maintenance. He has not tolerated maintenance BCG as well as previously. He underwent cystoscopy with biopsy Aug 2022 which was benign. No gross hematuria.   HG T1 bladder ca dx Aug 2021  completed induction BCG  Repeat TURBT Jan 2022 HG Ta bladder cancer  completed second induction BCG and maintenance  Cysto bx Aug 2022 benign  Cysto bx May 2023 benign  Maintenance BCG June 2023   TURP 08/2022  Path Gleason 3+4=7 <5% prostate chips  TRUS prostate bx 11/25/22    07/05/2023: 68 year old man with a history of bladder cancer and prostate cancer here for surveillance cystoscopy. Patient also underwent a TURP in November 2023 and is doing well from that. Prostate chips showed Gleason 3+4=7 in less than 5% of the prostate chips. Subsequent prostate biopsy in February 2024 showed no malignancy. He's been on active surveillance. No hematuria in the interim.     ALLERGIES: NKDA    MEDICATIONS: Multivitamin     GU PSH: Bladder Instill AntiCA Agent - 03/29/2023, 04/12/2022, 04/05/2022, 03/29/2022, 2022, 2022, 2022, 2022, 2022, 2022, 2022, 2022, 2022, 2021, 2021, 2021, 2021, 2021, 2021 Cystoscopy - 07/05/2023, 12/28/2022, 06/03/2022, 01/25/2022, 09/20/2021, 2022, 2022, 2021 Cystoscopy TURBT <2 cm - 02/15/2022 Cystoscopy TURBT 2-5 cm - 2022, 2022, 2021 Cystoscopy TURP - 09/27/2022 Locm 300-399Mg /Ml Iodine,1Ml - 2021 Prostate Needle Biopsy - 11/25/2022     NON-GU PSH: Surgical Pathology, Gross And Microscopic Examination For Prostate Needle - 11/25/2022 Tonsillectomy - 1965 Visit Complexity (formerly GPC1X) - 12/28/2022     GU PMH: Bladder Cancer overlapping sites -  07/05/2023, - 03/29/2023, - 12/28/2022, - 11/25/2022, - 09/02/2022, - 06/03/2022, - 04/12/2022, - 04/05/2022, - 03/29/2022, - 02/23/2022, - 01/25/2022, - 09/20/2021, - 2022, - 2022, - 2022, - 2022, No evidence of recurrence on surveillance cystoscopy today. Patient will proceed with maintenance BCG and follow-up cystoscopy in 3 months., - 2022, - 2022, - 2022, - 2022, - 2022, - 2022, - 2022, - 2022, Discussed pathology report with patient which showed high-grade noninvasive bladder cancer. He is a candidate for 2nd BCG induction. He will begin is in approximately 2 weeks and have this next surveillance cystoscopy in 3 months. We also discussed should he recur after a 2nd induction BCG that he would not be a candidate for more BCG. I discussed with him the natural course of bladder cancer and its tendency to recur and progress. I discussed with patient radical cystectomy however he is not ready to consider this., - 2022, - 2021, - 2021, - 2021, - 2021, - 2021, - 2021, - 2021, Patient has now undergone TURBT and restaging TURBT which confirms high-grade T1 bladder cancer. We have discussed management options and will proceed with induction BCG weekly for 6 weeks. He will then have a surveillance cystoscopy done 3 months from restaging TURBT. We discussed the side effects of BCG and he voiced understanding., - 2021, - 2021, - 2021, Pathology shows high-grade pT1 urothelial cell carcinoma of the bladder. Muscle was present but not involved. Due to pathology as well as overall tumor burden patient will be scheduled for restaging TURBT and about 3 weeks. We have already discussed the need for postoperative induction BCG if repeat pathology confirms high-grade  T1. Risks and benefits of TURBT including but not limited to bleeding, pain, bladder perforation, damage to surrounding structures including urethra/prostate/ureteral orifice sees, need for Foley catheter, need for future treatment, pain all discussed with patient. 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 , - 2021 Prostate Cancer - 07/05/2023, - 12/28/2022, - 11/25/2022 Urinary Frequency - 07/05/2023, - 11/25/2022, - 10/27/2022, - 09/29/2022, - 09/02/2022, - 02/23/2022, - 09/20/2021, - 2021 ED due to arterial insufficiency - 12/28/2022, - 06/03/2022 Nocturia - 12/28/2022, - 10/27/2022, - 09/29/2022, - 09/02/2022, - 2021 Gross hematuria - 2021, - 2021, 22 French Foley catheter placed and irrigation attempted. No clots were irrigated however urine is moderately red still. Patient did not tolerate clot irrigation well. Based on CT scan there is large organized clot versus tumor. Discussed the risks and benefits of a cystoscopy with clot evacuation of possible TURBT. We discussed doing this today verses next week. Patient would like to proceed immediately. Risks and benefits of this discussed including but element she bleeding, infection, damage to surrounding structures, need for Foley catheter, need for future treatment, pain. Patient has agreed to proceed will be scheduled for surgery later today., - 2021    NON-GU PMH: Bacteriuria, Plan to send patient's urine for culture today and treat empirically so that he may start BCG on time as scheduled. - 2022    FAMILY HISTORY: Prostate Cancer - Father   SOCIAL HISTORY: Marital Status: Single Current Smoking Status: Patient has never smoked.   Tobacco Use Assessment Completed: Used Tobacco in last 30 days? Does not use smokeless tobacco. Drinks 8 drinks per year. Types of alcohol consumed: Wine.  Does not use drugs. Drinks 1 caffeinated drink per day. Has not had a blood transfusion.     Notes: ETOH rarely    REVIEW OF SYSTEMS:    GU Review Male:   Patient denies frequent urination, hard to  postpone urination, burning/ pain with urination, get up at night to urinate, leakage of urine, stream starts and stops, trouble starting your stream, have to strain to urinate , erection problems, and penile pain.  Gastrointestinal (Upper):   Patient denies nausea, vomiting, and indigestion/ heartburn.  Gastrointestinal (Lower):   Patient denies diarrhea and constipation.  Constitutional:   Patient denies fever, night sweats, weight loss, and fatigue.  Skin:   Patient denies skin rash/ lesion and itching.  Eyes:   Patient denies blurred vision and double vision.  Ears/ Nose/ Throat:   Patient denies sore throat and sinus problems.  Hematologic/Lymphatic:   Patient denies swollen glands and easy bruising.  Cardiovascular:   Patient denies leg swelling and chest pains.  Respiratory:   Patient denies cough and shortness of breath.  Endocrine:   Patient denies excessive thirst.  Musculoskeletal:   Patient denies back pain and joint pain.  Neurological:   Patient denies headaches and dizziness.  Psychologic:   Patient denies depression and anxiety.   VITAL SIGNS:      12/12/2023 03:24 PM  Weight 227 lb / 102.97 kg  Height 72.5 in / 184.15 cm  BP 107/72 mmHg  Pulse 96 /min  BMI 30.4 kg/m   MULTI-SYSTEM PHYSICAL EXAMINATION:    Constitutional: Well-nourished. No physical deformities. Normally developed. Good grooming.  Neck: Neck symmetrical, not swollen. Normal tracheal position.  Respiratory: Normal breath sounds. No labored breathing, no use of accessory muscles.   Cardiovascular: Regular rate and rhythm. II/VI sys murmur, no gallop.  Lymphatic: No enlargement of neck, axillae, groin.  Skin: No paleness, no jaundice, no cyanosis. No lesion, no ulcer, no rash.  Neurologic / Psychiatric: Oriented to time, oriented to place, oriented to person. No depression, no anxiety, no agitation.  Gastrointestinal: No mass, no tenderness, no rigidity, obese abdomen.   Eyes: Normal conjunctivae. Normal  eyelids.  Ears, Nose, Mouth, and Throat: Left ear no scars, no lesions, no masses. Right ear no scars, no lesions, no masses. Nose no scars, no lesions, no masses. Normal hearing. Normal lips.  Musculoskeletal: Normal gait and station of head and neck.     Complexity of Data:  Records Review:   Previous Patient Records  Urine Test Review:   Urinalysis   12/12/23  Urinalysis  Urine Appearance Clear   Urine Color Yellow   Urine Glucose 2+ mg/dL  Urine Bilirubin Neg mg/dL  Urine Ketones Neg mg/dL  Urine Specific Gravity 1.020   Urine Blood Neg ery/uL  Urine pH <=5.0   Urine Protein Neg mg/dL  Urine Urobilinogen 0.2 mg/dL  Urine Nitrites Neg   Urine Leukocyte Esterase Neg leu/uL   PROCEDURES:          Urinalysis - 81003 Dipstick Dipstick Cont'd  Color: Yellow Bilirubin: Neg mg/dL  Appearance: Clear Ketones: Neg mg/dL  Specific Gravity: 1.610 Blood: Neg ery/uL  pH: <=5.0 Protein: Neg mg/dL  Glucose: 2+ mg/dL Urobilinogen: 0.2 mg/dL    Nitrites: Neg    Leukocyte Esterase: Neg leu/uL    ASSESSMENT:      ICD-10 Details  1 GU:   Bladder Cancer overlapping sites - C67.8   2   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 Arita Miss. Pt is scheduled to undergo cysto, optilume urethral dilation, and possible TURBT on 12/19/23.

## 2023-12-20 ENCOUNTER — Encounter (HOSPITAL_COMMUNITY): Payer: Self-pay | Admitting: Urology

## 2023-12-22 DIAGNOSIS — N320 Bladder-neck obstruction: Secondary | ICD-10-CM | POA: Diagnosis not present

## 2023-12-22 DIAGNOSIS — R350 Frequency of micturition: Secondary | ICD-10-CM | POA: Diagnosis not present

## 2023-12-22 DIAGNOSIS — N401 Enlarged prostate with lower urinary tract symptoms: Secondary | ICD-10-CM | POA: Diagnosis not present

## 2024-01-22 DIAGNOSIS — C61 Malignant neoplasm of prostate: Secondary | ICD-10-CM | POA: Diagnosis not present

## 2024-01-22 DIAGNOSIS — N320 Bladder-neck obstruction: Secondary | ICD-10-CM | POA: Diagnosis not present

## 2024-03-29 DIAGNOSIS — C61 Malignant neoplasm of prostate: Secondary | ICD-10-CM | POA: Diagnosis not present

## 2024-03-29 DIAGNOSIS — R351 Nocturia: Secondary | ICD-10-CM | POA: Diagnosis not present

## 2024-03-29 DIAGNOSIS — C678 Malignant neoplasm of overlapping sites of bladder: Secondary | ICD-10-CM | POA: Diagnosis not present

## 2024-03-29 DIAGNOSIS — N401 Enlarged prostate with lower urinary tract symptoms: Secondary | ICD-10-CM | POA: Diagnosis not present

## 2024-06-07 DIAGNOSIS — R252 Cramp and spasm: Secondary | ICD-10-CM | POA: Diagnosis not present

## 2024-06-07 DIAGNOSIS — R011 Cardiac murmur, unspecified: Secondary | ICD-10-CM | POA: Diagnosis not present

## 2024-06-07 DIAGNOSIS — L989 Disorder of the skin and subcutaneous tissue, unspecified: Secondary | ICD-10-CM | POA: Diagnosis not present

## 2024-06-18 DIAGNOSIS — L989 Disorder of the skin and subcutaneous tissue, unspecified: Secondary | ICD-10-CM | POA: Diagnosis not present

## 2024-06-18 DIAGNOSIS — R634 Abnormal weight loss: Secondary | ICD-10-CM | POA: Diagnosis not present

## 2024-06-18 DIAGNOSIS — R011 Cardiac murmur, unspecified: Secondary | ICD-10-CM | POA: Diagnosis not present

## 2024-06-18 DIAGNOSIS — Z8551 Personal history of malignant neoplasm of bladder: Secondary | ICD-10-CM | POA: Diagnosis not present

## 2024-06-18 DIAGNOSIS — M79604 Pain in right leg: Secondary | ICD-10-CM | POA: Diagnosis not present

## 2024-06-18 DIAGNOSIS — I491 Atrial premature depolarization: Secondary | ICD-10-CM | POA: Diagnosis not present

## 2024-06-18 DIAGNOSIS — M79605 Pain in left leg: Secondary | ICD-10-CM | POA: Diagnosis not present

## 2024-06-18 DIAGNOSIS — M791 Myalgia, unspecified site: Secondary | ICD-10-CM | POA: Diagnosis not present

## 2024-06-25 DIAGNOSIS — Z125 Encounter for screening for malignant neoplasm of prostate: Secondary | ICD-10-CM | POA: Diagnosis not present

## 2024-06-25 DIAGNOSIS — J3489 Other specified disorders of nose and nasal sinuses: Secondary | ICD-10-CM | POA: Diagnosis not present

## 2024-06-25 DIAGNOSIS — E1165 Type 2 diabetes mellitus with hyperglycemia: Secondary | ICD-10-CM | POA: Diagnosis not present

## 2024-07-09 DIAGNOSIS — Z6830 Body mass index (BMI) 30.0-30.9, adult: Secondary | ICD-10-CM | POA: Diagnosis not present

## 2024-07-09 DIAGNOSIS — E1165 Type 2 diabetes mellitus with hyperglycemia: Secondary | ICD-10-CM | POA: Diagnosis not present

## 2024-07-09 DIAGNOSIS — E6689 Other obesity not elsewhere classified: Secondary | ICD-10-CM | POA: Diagnosis not present

## 2024-07-17 DIAGNOSIS — R011 Cardiac murmur, unspecified: Secondary | ICD-10-CM | POA: Diagnosis not present

## 2024-07-22 DIAGNOSIS — E1165 Type 2 diabetes mellitus with hyperglycemia: Secondary | ICD-10-CM | POA: Diagnosis not present

## 2024-07-29 ENCOUNTER — Ambulatory Visit: Attending: Cardiology | Admitting: Cardiology

## 2024-07-29 ENCOUNTER — Encounter: Payer: Self-pay | Admitting: Cardiology

## 2024-07-29 VITALS — BP 118/70 | HR 86 | Ht 72.0 in | Wt 226.0 lb

## 2024-07-29 DIAGNOSIS — I350 Nonrheumatic aortic (valve) stenosis: Secondary | ICD-10-CM

## 2024-07-29 NOTE — Patient Instructions (Signed)
 Medication Instructions:  Your physician recommends that you continue on your current medications as directed. Please refer to the Current Medication list given to you today.  *If you need a refill on your cardiac medications before your next appointment, please call your pharmacy*  Lab Work: NONE If you have labs (blood work) drawn today and your tests are completely normal, you will receive your results only by: MyChart Message (if you have MyChart) OR A paper copy in the mail If you have any lab test that is abnormal or we need to change your treatment, we will call you to review the results.  Testing/Procedures: Echocardiogram in 6 months  Follow-Up: At [facility], you and your health needs are our priority.  As part of our continuing mission to provide you with exceptional heart care, our providers are all part of one team.  This team includes your primary Cardiologist (physician) and Advanced Practice Providers or APPs (Physician Assistants and Nurse Practitioners) who all work together to provide you with the care you need, when you need it.  Your next appointment:   6 month(s) after Echo  Provider:   [place], MD  We recommend signing up for the patient portal called MyChart.  Sign up information is provided on this After Visit Summary.  MyChart is used to connect with patients for Virtual Visits (Telemedicine).  Patients are able to view lab/test results, encounter notes, upcoming appointments, etc.  Non-urgent messages can be sent to your provider as well.   To learn more about what you can do with MyChart, go to [email or web address].

## 2024-07-29 NOTE — Progress Notes (Signed)
 Cardiology Office Note   Date:  07/29/2024   ID:  Kevin Buck, DOB [birth year], MRN [id number]  PCP:  Patient, No Pcp Per  Cardiologist:   None Referring:  [name], FNP  Chief Complaint  Patient presents with   Heart Murmur      History of Present Illness: Kevin Buck is a 68 y.o. male who presents for evaluation of a murmur.  He had a an echocardiogram done recently for evaluation of the murmur.  He was told as a child he had a murmur and then may be for recently as an adult but he kind of ignored it.  He has been found to have diabetes recently and reports that his A1c is 10.  He saw his primary provider and was noted to have a murmur.  He denies any cardiovascular symptoms however.  He rides a bike.  He delivers paint. The patient denies any new symptoms such as chest discomfort, neck or arm discomfort. There has been no new shortness of breath, PND or orthopnea. There have been no reported palpitations, presyncope or syncope.   He has had bladder cancer treated with TURBT and BCG therapy.  He has not had any other prior cardiac workup.  I was able to get the echocardiogram done at his primary office faxed to me.  There is mild concentric left ventricular hypertrophy with a well-preserved ejection fraction.  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  The mean gradient is 47.6.   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    Past Surgical History:   Procedure Laterality Date   COLONOSCOPY WITH PROPOFOL   08/29/2022   by [name]   CYSTOSCOPY WITH BIOPSY N/A 06/08/2021   Procedure: CYSTOSCOPY WITH BIOPSY AND FULGERATION; POST OPERATIVE INSTILLATION OF GEMCITABINE ;  Surgeon: [name], MD;  Location: [facility] [place];  Service: Urology;  Laterality: N/A;   CYSTOSCOPY WITH BIOPSY N/A 02/15/2022   Procedure: CYSTOSCOPY WITH BIOPSY;  Surgeon: [name], MD;  Location: [facility];  Service: Urology;  Laterality: N/A;   CYSTOSCOPY WITH FULGERATION N/A 09/27/2022   Procedure: CYSTOSCOPY WITH FULGERATION;  Surgeon: [name], MD;  Location: [facility];  Service: Urology;  Laterality: N/A;   CYSTOSCOPY WITH URETHRAL DILATATION N/A 12/19/2023   Procedure: DIAGNOSTIC CYSTOSCOPY,BLADDER NECK INCISION, OPTILUME  URETHRAL DILATATION;  Surgeon: [name], MD;  Location: [facility];  Service: Urology;  Laterality: N/A;   TONSILLECTOMY  1965   TRANSURETHRAL RESECTION OF BLADDER TUMOR N/A 06/11/2020   Procedure: CYSTO CLOT EVACUATION  TRANSURETHRAL RESECTION OF BLADDER TUMOR (TURBT);  Surgeon: [name], MD;  Location: [facility];  Service: Urology;  Laterality: N/A;   TRANSURETHRAL RESECTION OF BLADDER TUMOR N/A 10/20/2020   Procedure: TRANSURETHRAL RESECTION OF BLADDER TUMOR (TURBT);  Surgeon: [name], MD;  Location: [facility];  Service: Urology;  Laterality: N/A;   TRANSURETHRAL RESECTION OF  BLADDER TUMOR N/A 02/15/2022   Procedure: TRANSURETHRAL RESECTION OF BLADDER TUMOR (TURBT);  Surgeon: [name], MD;  Location: [facility];  Service: Urology;  Laterality: N/A;   TRANSURETHRAL RESECTION OF BLADDER TUMOR WITH MITOMYCIN -C N/A 07/14/2020   Procedure: RESTAGING TRANSURETHRAL RESECTION OF BLADDER TUMOR;  Surgeon: [name], MD;  Location: [facility];  Service: Urology;  Laterality: N/A;   TRANSURETHRAL RESECTION OF PROSTATE N/A  09/27/2022   Procedure: TRANSURETHRAL RESECTION OF THE PROSTATE (TURP);  Surgeon: [name], MD;  Location: [facility];  Service: Urology;  Laterality: N/A;   WISDOM TOOTH EXTRACTION  1983     Current Outpatient Medications  Medication Sig Dispense Refill   Guarana, Paullinia cupana, (GUARANA PO) Take 1 capsule by mouth daily.     metFORMIN (GLUCOPHAGE) 500 MG tablet Take 500 mg by mouth daily.     Multiple Vitamin (MULTIVITAMIN) tablet Take 1 tablet by mouth daily.     traMADol  (ULTRAM ) 50 MG tablet Take 1 tablet (50 mg total) by mouth every 6 (six) hours as needed. 12 tablet 0   hyoscyamine  (ANASPAZ ) 0.125 MG TBDP disintergrating tablet Place 1 tablet (0.125 mg total) under the tongue every 6 (six) hours as needed for bladder spasms. (Patient not taking: Reported on 07/29/2024) 15 tablet 0   No current facility-administered medications for this visit.   Facility-Administered Medications Ordered in Other Visits  Medication Dose Route Frequency Provider Last Rate Last Admin   gemcitabine  (GEMZAR ) chemo syringe for bladder instillation 2,000 mg  2,000 mg Bladder Instillation Once Pace, [name], MD        Allergies:   Patient has no known allergies.    Social History:  The patient  reports that he has never smoked. He has never used smokeless tobacco. He reports that he does not currently use alcohol. He reports that he does not use drugs.   Family History:  The patient's family history includes Cancer in his mother.    ROS:  Please see the history of present illness.   Otherwise, review of systems are positive for none.   All other systems are reviewed and negative.    PHYSICAL EXAM: VS:  BP 118/70   Pulse 86   Ht 6' (1.829 m)   Wt 226 lb (102.5 kg)   SpO2 97%   BMI 30.65 kg/m  , BMI Body mass index is 30.65 kg/m. GENERAL:  Well appearing HEENT:  Pupils equal round and reactive, fundi not visualized, oral mucosa unremarkable NECK:  No jugular  venous distention, waveform within normal limits, carotid upstroke reduced and delayed and symmetric, no bruits, no thyromegaly LYMPHATICS:  No cervical, inguinal adenopathy LUNGS:  Clear to auscultation bilaterally BACK:  No CVA tenderness CHEST:  Unremarkable HEART:  PMI not displaced or sustained,S1 and S2 within normal limits, no S3, no S4, no clicks, no rubs, 3 out of 6 late peaking murmur heard at the third left intercostal space without significant change in Valsalva, no diastolic murmurs ABD:  Flat, positive bowel sounds normal in frequency in pitch, no bruits, no rebound, no guarding, no midline pulsatile mass, no hepatomegaly, no splenomegaly EXT:  2 plus pulses throughout, no edema, no cyanosis no clubbing SKIN:  No rashes no nodules NEURO:  Cranial nerves II through XII grossly intact, motor grossly intact throughout [facility]:  Cognitively intact, oriented to person place and time    EKG:    12/19/2023 normal sinus rhythm, PACs, rate 95, poor  anterior R wave progression, no acute ST-T wave changes.    Recent Labs: No results found for requested labs within last 365 days.    Lipid Panel No results found for: CHOL, TRIG, HDL, CHOLHDL, VLDL, LDLCALC, LDLDIRECT    Wt Readings from Last 3 Encounters:  07/29/24 226 lb (102.5 kg)  12/19/23 224 lb (101.6 kg)  09/27/22 242 lb 14.4 oz (110.2 kg)      Other studies Reviewed: Additional studies/ records that were reviewed today include: Primary care records, echocardiogram. Review of the above records demonstrates:  Please see elsewhere in the note.     ASSESSMENT AND PLAN:   Murmur: The patient has severe aortic stenosis.  However, on careful questioning he has no symptoms.  At this point I plan on repeating an echocardiogram in 6 months.  We talked about the symptoms that would prompt further evaluation.  No change in therapy is warranted.  Diabetes mellitus: This is a new diagnosis.  He has been started on  metformin and has had diet control.  This will be followed by his primary provider.  Risk reduction: I would like to see a lipid profile.  I think with his diabetes he should have aggressive LDL goal in the 70s.  Current medicines are reviewed at length with the patient today.  The patient does not have concerns regarding medicines.  The following changes have been made:  no change  Labs/ tests ordered today include:   Orders Placed This Encounter  Procedures   ECHOCARDIOGRAM COMPLETE     Disposition:   FU with with me in six months after the echo.     Signed, Lynwood Schilling, MD  07/29/2024 12:08 PM    [facility]

## 2024-08-15 DIAGNOSIS — Z6830 Body mass index (BMI) 30.0-30.9, adult: Secondary | ICD-10-CM | POA: Diagnosis not present

## 2024-08-15 DIAGNOSIS — Z125 Encounter for screening for malignant neoplasm of prostate: Secondary | ICD-10-CM | POA: Diagnosis not present

## 2024-08-15 DIAGNOSIS — E1165 Type 2 diabetes mellitus with hyperglycemia: Secondary | ICD-10-CM | POA: Diagnosis not present

## 2024-08-15 DIAGNOSIS — E785 Hyperlipidemia, unspecified: Secondary | ICD-10-CM | POA: Diagnosis not present

## 2024-08-15 DIAGNOSIS — E6609 Other obesity due to excess calories: Secondary | ICD-10-CM | POA: Diagnosis not present

## 2024-08-23 ENCOUNTER — Ambulatory Visit: Admitting: Internal Medicine

## 2025-01-27 ENCOUNTER — Ambulatory Visit (HOSPITAL_COMMUNITY)
